# Patient Record
Sex: Male | Born: 1970
Health system: Southern US, Community
[De-identification: ages and names within clinical notes are randomized; demographics above are authoritative.]

## PROBLEM LIST (undated history)

## (undated) DIAGNOSIS — E785 Hyperlipidemia, unspecified: Secondary | ICD-10-CM

## (undated) DIAGNOSIS — F419 Anxiety disorder, unspecified: Secondary | ICD-10-CM

## (undated) DIAGNOSIS — K219 Gastro-esophageal reflux disease without esophagitis: Secondary | ICD-10-CM

## (undated) HISTORY — DX: Hyperlipidemia, unspecified: E78.5

## (undated) HISTORY — PX: VASECTOMY: SHX75

## (undated) HISTORY — DX: Gastro-esophageal reflux disease without esophagitis: K21.9

## (undated) HISTORY — DX: Anxiety disorder, unspecified: F41.9

---

## 2003-06-10 ENCOUNTER — Encounter: Payer: Self-pay | Admitting: Family Medicine

## 2003-06-10 ENCOUNTER — Ambulatory Visit (HOSPITAL_COMMUNITY): Admission: RE | Admit: 2003-06-10 | Discharge: 2003-06-10 | Payer: Self-pay | Admitting: Family Medicine

## 2004-10-13 ENCOUNTER — Ambulatory Visit: Payer: Self-pay | Admitting: Family Medicine

## 2004-10-20 ENCOUNTER — Ambulatory Visit: Payer: Self-pay | Admitting: Family Medicine

## 2004-11-02 ENCOUNTER — Encounter (INDEPENDENT_AMBULATORY_CARE_PROVIDER_SITE_OTHER): Payer: Self-pay | Admitting: Specialist

## 2004-11-02 ENCOUNTER — Ambulatory Visit (HOSPITAL_BASED_OUTPATIENT_CLINIC_OR_DEPARTMENT_OTHER): Admission: RE | Admit: 2004-11-02 | Discharge: 2004-11-02 | Payer: Self-pay | Admitting: Urology

## 2004-12-16 ENCOUNTER — Ambulatory Visit: Payer: Self-pay | Admitting: Family Medicine

## 2006-06-03 ENCOUNTER — Ambulatory Visit: Payer: Self-pay | Admitting: Family Medicine

## 2006-11-01 ENCOUNTER — Ambulatory Visit: Payer: Self-pay | Admitting: Family Medicine

## 2006-11-30 ENCOUNTER — Ambulatory Visit: Payer: Self-pay | Admitting: Family Medicine

## 2007-03-24 ENCOUNTER — Ambulatory Visit: Payer: Self-pay | Admitting: Family Medicine

## 2007-03-24 LAB — CONVERTED CEMR LAB
BUN: 12 mg/dL (ref 6–23)
Basophils Relative: 0 % (ref 0–1)
Calcium: 8.5 mg/dL (ref 8.4–10.5)
Chloride: 109 meq/L (ref 96–112)
Cholesterol: 161 mg/dL (ref 0–200)
Creatinine, Ser: 0.98 mg/dL (ref 0.40–1.50)
Eosinophils Absolute: 0.3 10*3/uL (ref 0.0–0.7)
Eosinophils Relative: 3 % (ref 0–5)
HCT: 44.5 % (ref 39.0–52.0)
HDL: 24 mg/dL — ABNORMAL LOW (ref >39)
Hemoglobin: 15.4 g/dL (ref 13.0–17.0)
Lymphs Abs: 1.8 10*3/uL (ref 0.7–3.3)
MCV: 84.6 fL (ref 78.0–100.0)
Platelets: 219 10*3/uL (ref 150–400)
Sodium: 144 meq/L (ref 135–145)
Total CHOL/HDL Ratio: 6.7
Triglycerides: 418 mg/dL — ABNORMAL HIGH (ref <150)
WBC: 9.7 10*3/uL (ref 4.0–10.5)

## 2007-06-20 DIAGNOSIS — K219 Gastro-esophageal reflux disease without esophagitis: Secondary | ICD-10-CM | POA: Insufficient documentation

## 2009-05-05 ENCOUNTER — Ambulatory Visit: Payer: Self-pay | Admitting: Family Medicine

## 2009-05-05 DIAGNOSIS — L0292 Furuncle, unspecified: Secondary | ICD-10-CM | POA: Insufficient documentation

## 2009-05-05 DIAGNOSIS — F411 Generalized anxiety disorder: Secondary | ICD-10-CM | POA: Insufficient documentation

## 2009-05-05 DIAGNOSIS — L0293 Carbuncle, unspecified: Secondary | ICD-10-CM

## 2009-05-05 DIAGNOSIS — B356 Tinea cruris: Secondary | ICD-10-CM

## 2009-05-05 DIAGNOSIS — E785 Hyperlipidemia, unspecified: Secondary | ICD-10-CM

## 2009-05-06 ENCOUNTER — Encounter: Payer: Self-pay | Admitting: Family Medicine

## 2009-11-04 ENCOUNTER — Ambulatory Visit: Payer: Self-pay | Admitting: Family Medicine

## 2009-11-04 DIAGNOSIS — J209 Acute bronchitis, unspecified: Secondary | ICD-10-CM | POA: Insufficient documentation

## 2010-02-17 ENCOUNTER — Ambulatory Visit: Payer: Self-pay | Admitting: Family Medicine

## 2010-10-25 LAB — CONVERTED CEMR LAB
AST: 23 units/L (ref 0–37)
Alkaline Phosphatase: 107 units/L (ref 39–117)
BUN: 14 mg/dL (ref 6–23)
Basophils Absolute: 0 10*3/uL (ref 0.0–0.1)
CO2: 30 meq/L (ref 19–32)
Calcium: 8.9 mg/dL (ref 8.4–10.5)
Chloride: 108 meq/L (ref 96–112)
Cholesterol: 156 mg/dL (ref 0–200)
Creatinine, Ser: 0.9 mg/dL (ref 0.4–1.5)
Eosinophils Absolute: 0.3 10*3/uL (ref 0.0–0.7)
Eosinophils Relative: 3.4 % (ref 0.0–5.0)
GFR calc non Af Amer: 97.52 mL/min (ref >60)
LDL Cholesterol: 99 mg/dL (ref 0–99)
MCHC: 35 g/dL (ref 30.0–36.0)
Monocytes Absolute: 0.5 10*3/uL (ref 0.1–1.0)
Monocytes Relative: 6.3 % (ref 3.0–12.0)
Neutrophils Relative %: 59.9 % (ref 43.0–77.0)
Platelets: 210 10*3/uL (ref 150.0–400.0)
Potassium: 4.8 meq/L (ref 3.5–5.1)
RDW: 13 % (ref 11.5–14.6)
TSH: 1.73 microintl units/mL (ref 0.35–5.50)
Total Protein: 6.7 g/dL (ref 6.0–8.3)
VLDL: 29 mg/dL (ref 0.0–40.0)

## 2010-10-27 NOTE — Assessment & Plan Note (Signed)
Summary: cpx/pt will come in fasting/njr   Vital Signs:  Patient profile:   40 year old male Weight:      251 pounds BMI:     35.13 Pulse rate:   64 / minute Pulse rhythm:   irregular BP sitting:   110 / 84  (left arm) Cuff size:   large  Vitals Entered By: Raechel Ache, RN (Feb 17, 2010 9:10 AM) CC: CPX, fasting. Recent vertigo, better now. Legs get sore after sitting.    History of Present Illness: 40 yr old male for a cpx. He feels fine, but he has been under a lot of financial stress. He has been unemployed for 2 years, and although his wife works full time, they have been having sokme difficulties. he tries to exercise regularly, including playing basketball. He tries to eat well, and he has lost about 16 lbs in the past year.   Preventive Screening-Counseling & Management  Alcohol-Tobacco     Smoking Status: never  Allergies (verified): No Known Drug Allergies  Past History:  Past Medical History: GERD Anxiety Hyperlipidemia MRSA infection in boils in axillae and groins 04-2009  Past Surgical History: Reviewed history from 06/20/2007 and no changes required. Vasectomy  2007  Family History: Reviewed history from 05/05/2009 and no changes required. Family History of CAD Male 1st degree relative <50 Family History Hypertension Family History Breast cancer 1st degree relative <50  Social History: Reviewed history from 05/05/2009 and no changes required. Married Never Smoked Alcohol use-no Drug use-no  Review of Systems  The patient denies anorexia, fever, weight loss, weight gain, vision loss, decreased hearing, hoarseness, chest pain, syncope, dyspnea on exertion, peripheral edema, prolonged cough, headaches, hemoptysis, abdominal pain, melena, hematochezia, severe indigestion/heartburn, hematuria, incontinence, genital sores, muscle weakness, suspicious skin lesions, transient blindness, difficulty walking, unusual weight change, abnormal bleeding,  enlarged lymph nodes, angioedema, breast masses, and testicular masses.    Physical Exam  General:  overweight-appearing.   Head:  Normocephalic and atraumatic without obvious abnormalities. No apparent alopecia or balding. Eyes:  No corneal or conjunctival inflammation noted. EOMI. Perrla. Funduscopic exam benign, without hemorrhages, exudates or papilledema. Vision grossly normal. Ears:  External ear exam shows no significant lesions or deformities.  Otoscopic examination reveals clear canals, tympanic membranes are intact bilaterally without bulging, retraction, inflammation or discharge. Hearing is grossly normal bilaterally. Nose:  External nasal examination shows no deformity or inflammation. Nasal mucosa are pink and moist without lesions or exudates. Mouth:  Oral mucosa and oropharynx without lesions or exudates.  Teeth in good repair. Neck:  No deformities, masses, or tenderness noted. Chest Wall:  No deformities, masses, tenderness or gynecomastia noted. Lungs:  Normal respiratory effort, chest expands symmetrically. Lungs are clear to auscultation, no crackles or wheezes. Heart:  Normal rate and regular rhythm. S1 and S2 normal without gallop, murmur, click, rub or other extra sounds. EKG normal Abdomen:  Bowel sounds positive,abdomen soft and non-tender without masses, organomegaly or hernias noted. Rectal:  No external abnormalities noted. Normal sphincter tone. No rectal masses or tenderness. Genitalia:  Testes bilaterally descended without nodularity, tenderness or masses. No scrotal masses or lesions. No penis lesions or urethral discharge. Prostate:  Prostate gland firm and smooth, no enlargement, nodularity, tenderness, mass, asymmetry or induration. Msk:  No deformity or scoliosis noted of thoracic or lumbar spine.   Pulses:  R and L carotid,radial,femoral,dorsalis pedis and posterior tibial pulses are full and equal bilaterally Extremities:  No clubbing, cyanosis, edema, or  deformity noted  with normal full range of motion of all joints.   Neurologic:  No cranial nerve deficits noted. Station and gait are normal. Plantar reflexes are down-going bilaterally. DTRs are symmetrical throughout. Sensory, motor and coordinative functions appear intact. Skin:  Intact without suspicious lesions or rashes Cervical Nodes:  No lymphadenopathy noted Axillary Nodes:  No palpable lymphadenopathy Inguinal Nodes:  No significant adenopathy Psych:  Cognition and judgment appear intact. Alert and cooperative with normal attention span and concentration. No apparent delusions, illusions, hallucinations   Impression & Recommendations:  Problem # 1:  HEALTH MAINTENANCE EXAM (ICD-V70.0)  Orders: UA Dipstick w/o Micro (automated)  (81003) EKG w/ Interpretation (93000) Venipuncture (16109) TLB-Lipid Panel (80061-LIPID) TLB-BMP (Basic Metabolic Panel-BMET) (80048-METABOL) TLB-CBC Platelet - w/Differential (85025-CBCD) TLB-Hepatic/Liver Function Pnl (80076-HEPATIC) TLB-TSH (Thyroid Stimulating Hormone) (60454-UJW)  Patient Instructions: 1)  Get labs today  Appended Document: Orders Update    Clinical Lists Changes  Observations: Added new observation of COMMENTS: Wynona Canes, CMA  Feb 17, 2010 10:29 AM  (02/17/2010 10:28) Added new observation of APPEARANCE U: Clear (02/17/2010 10:28) Added new observation of UA COLOR: yellow (02/17/2010 10:28) Added new observation of WBC DIPSTK U: negative (02/17/2010 10:28) Added new observation of NITRITE URN: negative (02/17/2010 10:28) Added new observation of UROBILINOGEN: negative (02/17/2010 10:28) Added new observation of PROTEIN, URN: 1+ (02/17/2010 10:28) Added new observation of BLOOD UR DIP: negative (02/17/2010 10:28) Added new observation of KETONES URN: negative (02/17/2010 10:28) Added new observation of BILIRUBIN UR: negative (02/17/2010 10:28) Added new observation of GLUCOSE, URN: negative (02/17/2010  10:28)      Laboratory Results   Urine Tests  Date/Time Recieved: Feb 17, 2010 10:28 AM  Date/Time Reported: Feb 17, 2010 10:28 AM   Routine Urinalysis   Color: yellow Appearance: Clear Glucose: negative   (Normal Range: Negative) Bilirubin: negative   (Normal Range: Negative) Ketone: negative   (Normal Range: Negative) Blood: negative   (Normal Range: Negative) Protein: 1+   (Normal Range: Negative) Urobilinogen: negative   (Normal Range: 0-1) Nitrite: negative   (Normal Range: Negative) Leukocyte Esterace: negative   (Normal Range: Negative)    Comments: Wynona Canes, CMA  Feb 17, 2010 10:29 AM

## 2010-10-27 NOTE — Assessment & Plan Note (Signed)
Summary: COUGH CHEST HURTS/PS   Vital Signs:  Patient profile:   40 year old male Weight:      262 pounds Temp:     98.6 degrees F oral BP sitting:   132 / 78  (left arm) Cuff size:   large  Vitals Entered By: Alfred Levins, CMA (November 04, 2009 9:39 AM) CC: cough, congestion x1 wk   History of Present Illness: Here for one week of PND, ST, chest burning, and coughing up green sputum. No fever.   Current Medications (verified): 1)  None  Allergies (verified): No Known Drug Allergies  Past History:  Past Medical History: Reviewed history from 05/05/2009 and no changes required. GERD Anxiety Hyperlipidemia  Review of Systems  The patient denies anorexia, fever, weight loss, weight gain, vision loss, decreased hearing, hoarseness, chest pain, syncope, dyspnea on exertion, peripheral edema, headaches, hemoptysis, abdominal pain, melena, hematochezia, severe indigestion/heartburn, hematuria, incontinence, genital sores, muscle weakness, suspicious skin lesions, transient blindness, difficulty walking, depression, unusual weight change, abnormal bleeding, enlarged lymph nodes, angioedema, breast masses, and testicular masses.    Physical Exam  General:  Well-developed,well-nourished,in no acute distress; alert,appropriate and cooperative throughout examination Head:  Normocephalic and atraumatic without obvious abnormalities. No apparent alopecia or balding. Eyes:  No corneal or conjunctival inflammation noted. EOMI. Perrla. Funduscopic exam benign, without hemorrhages, exudates or papilledema. Vision grossly normal. Ears:  External ear exam shows no significant lesions or deformities.  Otoscopic examination reveals clear canals, tympanic membranes are intact bilaterally without bulging, retraction, inflammation or discharge. Hearing is grossly normal bilaterally. Nose:  External nasal examination shows no deformity or inflammation. Nasal mucosa are pink and moist without lesions  or exudates. Mouth:  Oral mucosa and oropharynx without lesions or exudates.  Teeth in good repair. Neck:  No deformities, masses, or tenderness noted. Lungs:  scattered rhonchi   Impression & Recommendations:  Problem # 1:  ACUTE BRONCHITIS (ICD-466.0)  The following medications were removed from the medication list:    Doxycycline Hyclate 100 Mg Caps (Doxycycline hyclate) .Marland Kitchen..Marland Kitchen Two times a day His updated medication list for this problem includes:    Zithromax Z-pak 250 Mg Tabs (Azithromycin) .Marland Kitchen... As directed    Hydromet 5-1.5 Mg/38ml Syrp (Hydrocodone-homatropine) .Marland Kitchen... 1 tsp q 4 hours as needed cough  Complete Medication List: 1)  Zithromax Z-pak 250 Mg Tabs (Azithromycin) .... As directed 2)  Hydromet 5-1.5 Mg/50ml Syrp (Hydrocodone-homatropine) .Marland Kitchen.. 1 tsp q 4 hours as needed cough  Patient Instructions: 1)  Please schedule a follow-up appointment as needed .  Prescriptions: HYDROMET 5-1.5 MG/5ML SYRP (HYDROCODONE-HOMATROPINE) 1 tsp q 4 hours as needed cough  #240 x 0   Entered and Authorized by:   Nelwyn Salisbury MD   Signed by:   Nelwyn Salisbury MD on 11/04/2009   Method used:   Print then Give to Patient   RxID:   [id number] ZITHROMAX Z-PAK 250 MG TABS (AZITHROMYCIN) as directed  #1 x 0   Entered and Authorized by:   Nelwyn Salisbury MD   Signed by:   Nelwyn Salisbury MD on 11/04/2009   Method used:   Print then Give to Patient   RxID:   [id number]

## 2011-02-12 NOTE — Op Note (Signed)
NAME:  Daniel Rodriguez, Daniel Rodriguez                  ACCOUNT NO.:  [id number]   MEDICAL RECORD NO.:  [id number]          PATIENT TYPE:  AMB   LOCATION:  [facility]                         FACILITY:  [facility]   PHYSICIAN:  Sigmund I. Patsi Sears, M.D.DATE OF BIRTH:  1971-07-02   DATE OF PROCEDURE:  DATE OF DISCHARGE:                                 OPERATIVE REPORT   PREOPERATIVE DIAGNOSIS:  Elective sterilization.   POSTOPERATIVE DIAGNOSIS:  Elective sterilization.   OPERATION:  Vasectomy.   SURGEON:  Sigmund I. Patsi Sears, M.D.   ANESTHESIA:  General LMA.Marland Kitchen   PREPARATION:  After appropriate preanesthesia, the patient was brought to  the operating room, placed on the operating room in dorsal supine position  where general LMA anesthesia was introduced. He remained in this position,  where the pubis and penis were prepped with Betadine solution, draped in  usual fashion.   The patient had previously shaven and prepped himself, because of this, he  was covered with a Ancef 1 gram IV.   The vas was isolated bilaterally, a 0.5 cm incision was made bilaterally.  Subcutaneous tissue was dissected, and the vas was isolated, doubly clamped,  and the portion removed with the electrosurgical unit. Each end is then  ligated with 3-0 Vicryl suture, the wounds are closed with interrupted 3-0  Vicryl suture. Collodion was placed, and mesh pads were placed. The patient  was given IV Toradol, as well as local anesthesia. He was awakened and taken  to the recovery room in good condition.      SIT/MEDQ  D:  11/02/2004  T:  11/02/2004  Job:  [id number]

## 2011-09-20 ENCOUNTER — Encounter: Payer: Self-pay | Admitting: Family Medicine

## 2011-09-20 ENCOUNTER — Ambulatory Visit (INDEPENDENT_AMBULATORY_CARE_PROVIDER_SITE_OTHER): Payer: PRIVATE HEALTH INSURANCE | Admitting: Family Medicine

## 2011-09-20 VITALS — BP 140/92 | HR 113 | Temp 99.3°F | Ht 71.0 in | Wt 260.0 lb

## 2011-09-20 DIAGNOSIS — R05 Cough: Secondary | ICD-10-CM

## 2011-09-20 DIAGNOSIS — J209 Acute bronchitis, unspecified: Secondary | ICD-10-CM

## 2011-09-20 MED ORDER — AZITHROMYCIN 250 MG PO TABS: 250.0000 mg | ORAL_TABLET | Freq: Every day | ORAL | Status: AC

## 2011-09-20 NOTE — Progress Notes (Signed)
  Subjective:    Patient ID: Daniel Rodriguez, male    DOB: December 24, 1970, 40 y.o.   MRN: [id number]  HPI 40 year old white male, patient of Dr. Clent Ridges, nonsmoker, and complaints of cough, congestion, runny nose that's been getting progressively worse over the last one week. Cough is productive of yellow sputum. Did take an over-the-counter Delsym, he will, and Dimetapp with little to no relief. His past medical history of bronchitis and upper respiratory infections.  Review of Systems  Constitutional: Positive for fatigue.  HENT: Positive for congestion and postnasal drip.   Respiratory: Positive for cough.   Cardiovascular: Negative.   Gastrointestinal: Negative.        Nausea  Musculoskeletal: Negative.   Skin: Negative.   Neurological: Negative.   Hematological: Negative.    Past Medical History  Diagnosis Date  . GERD (gastroesophageal reflux disease)   . Anxiety   . Hyperlipidemia     History   Social History  . Marital Status: Married    Spouse Name: N/A    Number of Children: N/A  . Years of Education: N/A   Occupational History  . Not on file.   Social History Main Topics  . Smoking status: Never Smoker   . Smokeless tobacco: Not on file  . Alcohol Use: No  . Drug Use: No  . Sexually Active: Not on file   Other Topics Concern  . Not on file   Social History Narrative  . No narrative on file    Past Surgical History  Procedure Date  . Vasectomy     Family History  Problem Relation Age of Onset  . Hypertension      fhx  . Cancer      fhx  . Coronary artery disease      fhx    No Known Allergies  No current outpatient prescriptions on file prior to visit.    BP 140/92  Pulse 113  Temp(Src) 99.3 F (37.4 C) (Oral)  Ht 5\' 11"  (1.803 m)  Wt 260 lb (117.935 kg)  BMI 36.26 kg/m2chart   Objective:   Physical Exam  Constitutional: He is oriented to person, place, and time. He appears well-developed and well-nourished.  Neck: Normal range of motion.  Neck supple.  Cardiovascular: Normal rate, regular rhythm and normal heart sounds.   Pulmonary/Chest: Effort normal and breath sounds normal.  Abdominal: Soft. Bowel sounds are normal.  Musculoskeletal: Normal range of motion.  Neurological: He is alert and oriented to person, place, and time.  Skin: Skin is warm and dry.  Psychiatric: He has a normal mood and affect.          Assessment & Plan:  Assessment: Acute bronchitis, cough  Plan: Z-Pak as directed. Over-the-counter Delsym when necessary. Antihistamine like Claritin or Zyrtec once daily. Patient to call the office if symptoms worsen or persist recheck as scheduled and when necessary.

## 2013-04-25 ENCOUNTER — Encounter: Payer: Self-pay | Admitting: Internal Medicine

## 2013-04-25 ENCOUNTER — Ambulatory Visit (INDEPENDENT_AMBULATORY_CARE_PROVIDER_SITE_OTHER): Payer: PRIVATE HEALTH INSURANCE | Admitting: Internal Medicine

## 2013-04-25 VITALS — BP 134/86 | HR 66 | Temp 98.0°F | Wt 273.0 lb

## 2013-04-25 DIAGNOSIS — J309 Allergic rhinitis, unspecified: Secondary | ICD-10-CM

## 2013-04-25 DIAGNOSIS — F439 Reaction to severe stress, unspecified: Secondary | ICD-10-CM

## 2013-04-25 DIAGNOSIS — Z733 Stress, not elsewhere classified: Secondary | ICD-10-CM

## 2013-04-25 DIAGNOSIS — G44209 Tension-type headache, unspecified, not intractable: Secondary | ICD-10-CM

## 2013-04-25 MED ORDER — CYCLOBENZAPRINE HCL 10 MG PO TABS: 10.0000 mg | ORAL_TABLET | Freq: Three times a day (TID) | ORAL | Status: DC | PRN

## 2013-04-25 MED ORDER — PREDNISONE 10 MG PO TABS: ORAL_TABLET | ORAL | Status: DC

## 2013-04-25 NOTE — Patient Instructions (Signed)
Allergic Rhinitis Allergic rhinitis is when the mucous membranes in the nose respond to allergens. Allergens are particles in the air that cause your body to have an allergic reaction. This causes you to release allergic antibodies. Through a chain of events, these eventually cause you to release histamine into the blood stream (hence the use of antihistamines). Although meant to be protective to the body, it is this release that causes your discomfort, such as frequent sneezing, congestion and an itchy runny nose.  CAUSES  The pollen allergens may come from grasses, trees, and weeds. This is seasonal allergic rhinitis, or "hay fever." Other allergens cause year-round allergic rhinitis (perennial allergic rhinitis) such as house dust mite allergen, pet dander and mold spores.  SYMPTOMS   Nasal stuffiness (congestion).  Runny, itchy nose with sneezing and tearing of the eyes.  There is often an itching of the mouth, eyes and ears. It cannot be cured, but it can be controlled with medications. DIAGNOSIS  If you are unable to determine the offending allergen, skin or blood testing may find it. TREATMENT   Avoid the allergen.  Medications and allergy shots (immunotherapy) can help.  Hay fever may often be treated with antihistamines in pill or nasal spray forms. Antihistamines block the effects of histamine. There are over-the-counter medicines that may help with nasal congestion and swelling around the eyes. Check with your caregiver before taking or giving this medicine. If the treatment above does not work, there are many new medications your caregiver can prescribe. Stronger medications may be used if initial measures are ineffective. Desensitizing injections can be used if medications and avoidance fails. Desensitization is when a patient is given ongoing shots until the body becomes less sensitive to the allergen. Make sure you follow up with your caregiver if problems continue. SEEK MEDICAL  CARE IF:   You develop fever (more than 100.5 F (38.1 C).  You develop a cough that does not stop easily (persistent).  You have shortness of breath.  You start wheezing.  Symptoms interfere with normal daily activities. Document Released: 06/08/2001 Document Revised: 12/06/2011 Document Reviewed: 12/18/2008 [employer] Patient Information 2014 [employer].  

## 2013-04-25 NOTE — Progress Notes (Signed)
HPI  Pt presents to the clinic today with c/o dizziness, sinus pressure and headache. This started 2 weeks ago. He denies fever, chills or body aches. He does have a history of allergies. He tried to take Sudafed and all it did was put him to sleep. He is not on a daily antihistamine. He has not had sick contacts. The headache is constant. It starts in the back of his neck and spreads up to the bottom of his head. He reports that it feels tight. He has taken ibuprofen which has helped. He does have a history of migraines and reports this feels different.  Review of Systems    Past Medical History  Diagnosis Date  . GERD (gastroesophageal reflux disease)   . Anxiety   . Hyperlipidemia     Family History  Problem Relation Age of Onset  . Hypertension      fhx  . Cancer      fhx  . Coronary artery disease      fhx    History   Social History  . Marital Status: Married    Spouse Name: N/A    Number of Children: N/A  . Years of Education: N/A   Occupational History  . Not on file.   Social History Main Topics  . Smoking status: Never Smoker   . Smokeless tobacco: Not on file  . Alcohol Use: No  . Drug Use: No  . Sexually Active: Not on file   Other Topics Concern  . Not on file   Social History Narrative  . No narrative on file    No Known Allergies   Constitutional: Positive headache, fatigue and fever. Denies abrupt weight changes.  HEENT:  Positive eye pain, pressure behind the eyes, facial pain, nasal congestion and sore throat. Denies eye redness, ear pain, ringing in the ears, wax buildup, runny nose or bloody nose. Respiratory: Positive cough and thick green sputum production. Denies difficulty breathing or shortness of breath.  Cardiovascular: Denies chest pain, chest tightness, palpitations or swelling in the hands or feet.   No other specific complaints in a complete review of systems (except as listed in HPI above).  Objective:    BP 134/86  Pulse  66  Temp(Src) 98 F (36.7 C) (Oral)  Wt 273 lb (123.832 kg)  BMI 38.09 kg/m2  SpO2 95% Wt Readings from Last 3 Encounters:  04/25/13 273 lb (123.832 kg)  09/20/11 260 lb (117.935 kg)  02/17/10 251 lb (113.853 kg)    General: Appears his stated age, well developed, well nourished in NAD. HEENT: Head: normal shape and size; Eyes: sclera white, no icterus, conjunctiva pink, PERRLA and EOMs intact; Ears: Tm's gray and intact, normal light reflex; Nose: mucosa boggy and moist, septum midline; Throat/Mouth: + PND. Teeth present, mucosa pink and moist, no exudate noted, no lesions or ulcerations noted.  Neck: Neck supple, trachea midline. No massses, lumps or thyromegaly present.  Cardiovascular: Normal rate and rhythm. S1,S2 noted.  No murmur, rubs or gallops noted. No JVD or BLE edema. No carotid bruits noted. Pulmonary/Chest: Normal effort and positive vesicular breath sounds. No respiratory distress. No wheezes, rales or ronchi noted.      Assessment & Plan:   Dizziness secondary to allergic rhinitis:  Can use a Neti Pot which can be purchased from your local drug store. eRx for pred taper  Headaches secondary to stress:  eRx for flexeril for tension headaches  RTC as needed or if symptoms persist or worsen  RTC as needed or if symptoms persist.

## 2013-12-06 ENCOUNTER — Emergency Department
Admission: EM | Admit: 2013-12-06 | Discharge: 2013-12-06 | Disposition: A | Discharge status: Home / Self Care | Payer: PRIVATE HEALTH INSURANCE | Source: Home / Self Care | Attending: Family Medicine | Admitting: Family Medicine

## 2013-12-06 ENCOUNTER — Encounter: Payer: Self-pay | Admitting: Emergency Medicine

## 2013-12-06 DIAGNOSIS — J069 Acute upper respiratory infection, unspecified: Secondary | ICD-10-CM

## 2013-12-06 MED ORDER — AZITHROMYCIN 250 MG PO TABS: ORAL_TABLET | ORAL | Status: DC

## 2013-12-06 NOTE — ED Provider Notes (Signed)
CSN: [id number]     Arrival date & time 12/06/13  1012 History   None    Chief Complaint  Patient presents with  . Nasal Congestion    HPI  URI Symptoms Onset: 2-3 days  Description: rhinorrhea, nasal congestion, cough  Modifying factors:  Multiple sick contacts with similar sxs   Symptoms Nasal discharge: yes Fever: no Sore throat: no Cough: yes Wheezing: no Ear pain: no GI symptoms: no Sick contacts: yes  Red Flags  Stiff neck: no Dyspnea: no Rash: no Swallowing difficulty: no  Sinusitis Risk Factors Headache/face pain: no Double sickening: no tooth pain: no  Allergy Risk Factors Sneezing: no Itchy scratchy throat: no Seasonal symptoms: no  Flu Risk Factors Headache: no muscle aches: no severe fatigue: no   Past Medical History  Diagnosis Date  . GERD (gastroesophageal reflux disease)   . Anxiety   . Hyperlipidemia    Past Surgical History  Procedure Laterality Date  . Vasectomy     Family History  Problem Relation Age of Onset  . Hypertension      fhx  . Cancer      fhx  . Coronary artery disease      fhx  . Coronary artery disease Father    History  Substance Use Topics  . Smoking status: Never Smoker   . Smokeless tobacco: Never Used  . Alcohol Use: No    Review of Systems  All other systems reviewed and are negative.    Allergies  Review of patient's allergies indicates no known allergies.  Home Medications   Current Outpatient Rx  Name  Route  Sig  Dispense  Refill  . azithromycin (ZITHROMAX) 250 MG tablet      Take 2 tabs PO x 1 dose, then 1 tab PO QD x 4 days   6 tablet   0    BP 143/87  Pulse 67  Temp(Src) 97.8 F (36.6 C) (Oral)  Resp 14  Wt 275 lb (124.739 kg)  SpO2 96% Physical Exam  Constitutional: He appears well-developed and well-nourished.  HENT:  Head: Normocephalic and atraumatic.  Right Ear: External ear normal.  Left Ear: External ear normal.  +nasal erythema, rhinorrhea bilaterally, + post  oropharyngeal erythema    Eyes: Pupils are equal, round, and reactive to light.  Neck: Normal range of motion. Neck supple.  Cardiovascular: Normal rate and regular rhythm.   Pulmonary/Chest: Effort normal and breath sounds normal.  Abdominal: Soft.  Musculoskeletal: Normal range of motion.  Neurological: He is alert.  Skin: Skin is warm.    ED Course  Procedures (including critical care time) Labs Review Labs Reviewed - No data to display Imaging Review No results found.   MDM   1. URI (upper respiratory infection)    Likely viral process Exam reassuring  Discussed supportive care and infectious/resp red flags.  PPx rx for zpak given if sxs worsen/fail to improve over next 7-10 days.  Follow up as needed.     The patient and/or caregiver has been counseled thoroughly with regard to treatment plan and/or medications prescribed including dosage, schedule, interactions, rationale for use, and possible side effects and they verbalize understanding. Diagnoses and expected course of recovery discussed and will return if not improved as expected or if the condition worsens. Patient and/or caregiver verbalized understanding.         Doree AlbeeSteven Cecia Egge, MD 12/06/13 1027

## 2013-12-06 NOTE — ED Notes (Signed)
Daniel Rodriguez c/o sneezing, congestion and non-productive cough x 1 week. No fever.

## 2013-12-07 ENCOUNTER — Encounter: Payer: Self-pay | Admitting: Family Medicine

## 2013-12-07 ENCOUNTER — Ambulatory Visit (INDEPENDENT_AMBULATORY_CARE_PROVIDER_SITE_OTHER): Payer: PRIVATE HEALTH INSURANCE | Admitting: Family Medicine

## 2013-12-07 VITALS — BP 154/98 | HR 76 | Temp 98.7°F | Ht 71.0 in | Wt 272.0 lb

## 2013-12-07 DIAGNOSIS — IMO0001 Reserved for inherently not codable concepts without codable children: Secondary | ICD-10-CM

## 2013-12-07 DIAGNOSIS — R030 Elevated blood-pressure reading, without diagnosis of hypertension: Secondary | ICD-10-CM

## 2013-12-07 LAB — HEPATIC FUNCTION PANEL
ALT: 31 U/L (ref 0–53)
AST: 23 U/L (ref 0–37)
Albumin: 4 g/dL (ref 3.5–5.2)
Alkaline Phosphatase: 105 U/L (ref 39–117)
Bilirubin, Direct: 0.1 mg/dL (ref 0.0–0.3)
Total Bilirubin: 0.7 mg/dL (ref 0.3–1.2)
Total Protein: 7 g/dL (ref 6.0–8.3)

## 2013-12-07 LAB — CBC WITH DIFFERENTIAL/PLATELET
BASOS PCT: 0.3 % (ref 0.0–3.0)
Basophils Absolute: 0 10*3/uL (ref 0.0–0.1)
Eosinophils Absolute: 0.3 10*3/uL (ref 0.0–0.7)
Eosinophils Relative: 3.6 % (ref 0.0–5.0)
HEMATOCRIT: 47.4 % (ref 39.0–52.0)
HEMOGLOBIN: 16 g/dL (ref 13.0–17.0)
LYMPHS ABS: 2.8 10*3/uL (ref 0.7–4.0)
Lymphocytes Relative: 30.3 % (ref 12.0–46.0)
MCHC: 33.9 g/dL (ref 30.0–36.0)
MCV: 88.8 fl (ref 78.0–100.0)
MONO ABS: 0.5 10*3/uL (ref 0.1–1.0)
MONOS PCT: 5.7 % (ref 3.0–12.0)
NEUTROS ABS: 5.5 10*3/uL (ref 1.4–7.7)
Neutrophils Relative %: 60.1 % (ref 43.0–77.0)
Platelets: 243 10*3/uL (ref 150.0–400.0)
RBC: 5.34 Mil/uL (ref 4.22–5.81)
RDW: 13.6 % (ref 11.5–14.6)
WBC: 9.1 10*3/uL (ref 4.5–10.5)

## 2013-12-07 LAB — POCT URINALYSIS DIPSTICK
Bilirubin, UA: NEGATIVE
GLUCOSE UA: NEGATIVE
KETONES UA: NEGATIVE
Leukocytes, UA: NEGATIVE
Nitrite, UA: NEGATIVE
RBC UA: NEGATIVE
Spec Grav, UA: 1.025
Urobilinogen, UA: 0.2
pH, UA: 6

## 2013-12-07 LAB — TSH: TSH: 1.97 u[IU]/mL (ref 0.35–5.50)

## 2013-12-07 LAB — BASIC METABOLIC PANEL
BUN: 13 mg/dL (ref 6–23)
CALCIUM: 8.9 mg/dL (ref 8.4–10.5)
CHLORIDE: 105 meq/L (ref 96–112)
CO2: 27 meq/L (ref 19–32)
CREATININE: 1 mg/dL (ref 0.4–1.5)
GFR: 85.91 mL/min (ref >60.00)
Glucose, Bld: 86 mg/dL (ref 70–99)
Potassium: 3.8 mEq/L (ref 3.5–5.1)
SODIUM: 140 meq/L (ref 135–145)

## 2013-12-07 NOTE — Progress Notes (Signed)
Pre visit review using our clinic review tool, if applicable. No additional management support is needed unless otherwise documented below in the visit note. 

## 2013-12-07 NOTE — Progress Notes (Signed)
   Subjective:    Patient ID: Daniel Rodriguez, male    DOB: 10/09/1970, 43 y.o.   MRN: [id number]  HPI Here with concerns over his BP. He admits to being overweight and having put on more weight in the past year. He does not watch his diet and he has not been active. No chest pain or HAs or SOB. He went to Urgent Care yesterday and was put on a Zapck for a URI. His BP was a little high then.    Review of Systems  Constitutional: Negative.   Respiratory: Negative.   Cardiovascular: Negative.        Objective:   Physical Exam  Constitutional: He appears well-developed and well-nourished.  Neck: No thyromegaly present.  Cardiovascular: Normal rate, regular rhythm, normal heart sounds and intact distal pulses.   EKG normal   Pulmonary/Chest: Effort normal and breath sounds normal.  Lymphadenopathy:    He has no cervical adenopathy.          Assessment & Plan:  He does have an elevated BP and we discussed his diet and exercise habits. He will make a concerted effort to lose weight and we will recheck him in 90 days.

## 2014-03-01 ENCOUNTER — Other Ambulatory Visit (INDEPENDENT_AMBULATORY_CARE_PROVIDER_SITE_OTHER): Payer: PRIVATE HEALTH INSURANCE

## 2014-03-01 DIAGNOSIS — Z0000 Encounter for general adult medical examination without abnormal findings: Secondary | ICD-10-CM

## 2014-03-01 LAB — HEPATIC FUNCTION PANEL
ALBUMIN: 3.7 g/dL (ref 3.5–5.2)
ALK PHOS: 103 U/L (ref 39–117)
ALT: 34 U/L (ref 0–53)
AST: 25 U/L (ref 0–37)
BILIRUBIN TOTAL: 0.8 mg/dL (ref 0.2–1.2)
Bilirubin, Direct: 0.1 mg/dL (ref 0.0–0.3)
Total Protein: 6.5 g/dL (ref 6.0–8.3)

## 2014-03-01 LAB — BASIC METABOLIC PANEL
BUN: 14 mg/dL (ref 6–23)
CALCIUM: 8.4 mg/dL (ref 8.4–10.5)
CO2: 28 meq/L (ref 19–32)
CREATININE: 0.9 mg/dL (ref 0.4–1.5)
Chloride: 106 mEq/L (ref 96–112)
GFR: 96.92 mL/min (ref >60.00)
GLUCOSE: 89 mg/dL (ref 70–99)
Potassium: 4.2 mEq/L (ref 3.5–5.1)
Sodium: 139 mEq/L (ref 135–145)

## 2014-03-01 LAB — CBC WITH DIFFERENTIAL/PLATELET
BASOS ABS: 0 10*3/uL (ref 0.0–0.1)
Basophils Relative: 0.3 % (ref 0.0–3.0)
EOS ABS: 0.3 10*3/uL (ref 0.0–0.7)
Eosinophils Relative: 4.2 % (ref 0.0–5.0)
HCT: 45.2 % (ref 39.0–52.0)
HEMOGLOBIN: 15.4 g/dL (ref 13.0–17.0)
LYMPHS PCT: 27.8 % (ref 12.0–46.0)
Lymphs Abs: 2.2 10*3/uL (ref 0.7–4.0)
MCHC: 34 g/dL (ref 30.0–36.0)
MCV: 89 fl (ref 78.0–100.0)
Monocytes Absolute: 0.6 10*3/uL (ref 0.1–1.0)
Monocytes Relative: 8 % (ref 3.0–12.0)
NEUTROS ABS: 4.6 10*3/uL (ref 1.4–7.7)
Neutrophils Relative %: 59.7 % (ref 43.0–77.0)
PLATELETS: 210 10*3/uL (ref 150.0–400.0)
RBC: 5.08 Mil/uL (ref 4.22–5.81)
RDW: 13.2 % (ref 11.5–15.5)
WBC: 7.8 10*3/uL (ref 4.0–10.5)

## 2014-03-01 LAB — LIPID PANEL
Cholesterol: 134 mg/dL (ref 0–200)
HDL: 26.8 mg/dL — ABNORMAL LOW (ref >39.00)
LDL Cholesterol: 84 mg/dL (ref 0–99)
NonHDL: 107.2
Total CHOL/HDL Ratio: 5
Triglycerides: 117 mg/dL (ref 0.0–149.0)
VLDL: 23.4 mg/dL (ref 0.0–40.0)

## 2014-03-01 LAB — POCT URINALYSIS DIPSTICK
Bilirubin, UA: NEGATIVE
Blood, UA: NEGATIVE
Glucose, UA: NEGATIVE
Ketones, UA: NEGATIVE
Leukocytes, UA: NEGATIVE
Nitrite, UA: NEGATIVE
SPEC GRAV UA: 1.025
UROBILINOGEN UA: 0.2
pH, UA: 6.5

## 2014-03-01 LAB — TSH: TSH: 2.35 u[IU]/mL (ref 0.35–4.50)

## 2014-03-08 ENCOUNTER — Ambulatory Visit (INDEPENDENT_AMBULATORY_CARE_PROVIDER_SITE_OTHER): Payer: PRIVATE HEALTH INSURANCE | Admitting: Family Medicine

## 2014-03-08 ENCOUNTER — Encounter: Payer: Self-pay | Admitting: Family Medicine

## 2014-03-08 VITALS — BP 159/97 | HR 75 | Temp 98.8°F | Ht 71.0 in | Wt 272.0 lb

## 2014-03-08 DIAGNOSIS — Z0000 Encounter for general adult medical examination without abnormal findings: Secondary | ICD-10-CM

## 2014-03-08 DIAGNOSIS — I10 Essential (primary) hypertension: Secondary | ICD-10-CM

## 2014-03-08 MED ORDER — LISINOPRIL-HYDROCHLOROTHIAZIDE 20-25 MG PO TABS: 1.0000 | ORAL_TABLET | Freq: Every day | ORAL | Status: DC

## 2014-03-08 NOTE — Progress Notes (Signed)
Pre visit review using our clinic review tool, if applicable. No additional management support is needed unless otherwise documented below in the visit note. 

## 2014-03-08 NOTE — Progress Notes (Signed)
   Subjective:    Patient ID: Daniel Rodriguez, male    DOB: 01/23/1971, 43 y.o.   MRN: [id number]  HPI 43 yr old male for a cpx. He feels well. We have been watching his BP and it remains high.    Review of Systems  Constitutional: Negative.   HENT: Negative.   Eyes: Negative.   Respiratory: Negative.   Cardiovascular: Negative.   Gastrointestinal: Negative.   Genitourinary: Negative.   Musculoskeletal: Negative.   Skin: Negative.   Neurological: Negative.   Psychiatric/Behavioral: Negative.        Objective:   Physical Exam  Constitutional: He is oriented to person, place, and time. He appears well-developed and well-nourished. No distress.  HENT:  Head: Normocephalic and atraumatic.  Right Ear: External ear normal.  Left Ear: External ear normal.  Nose: Nose normal.  Mouth/Throat: Oropharynx is clear and moist. No oropharyngeal exudate.  Eyes: Conjunctivae and EOM are normal. Pupils are equal, round, and reactive to light. Right eye exhibits no discharge. Left eye exhibits no discharge. No scleral icterus.  Neck: Neck supple. No JVD present. No tracheal deviation present. No thyromegaly present.  Cardiovascular: Normal rate, regular rhythm, normal heart sounds and intact distal pulses.  Exam reveals no gallop and no friction rub.   No murmur heard. Pulmonary/Chest: Effort normal and breath sounds normal. No respiratory distress. He has no wheezes. He has no rales. He exhibits no tenderness.  Abdominal: Soft. Bowel sounds are normal. He exhibits no distension and no mass. There is no tenderness. There is no rebound and no guarding.  Genitourinary: Rectum normal, prostate normal and penis normal. Guaiac negative stool. No penile tenderness.  Musculoskeletal: Normal range of motion. He exhibits no edema and no tenderness.  Lymphadenopathy:    He has no cervical adenopathy.  Neurological: He is alert and oriented to person, place, and time. He has normal reflexes. No cranial nerve  deficit. He exhibits normal muscle tone. Coordination normal.  Skin: Skin is warm and dry. No rash noted. He is not diaphoretic. No erythema. No pallor.  Psychiatric: He has a normal mood and affect. His behavior is normal. Judgment and thought content normal.          Assessment & Plan:  Well exam. Start on Lisinopril HCT and recheck in one month

## 2014-03-09 ENCOUNTER — Telehealth: Payer: Self-pay | Admitting: Family Medicine

## 2014-03-09 NOTE — Telephone Encounter (Signed)
Relevant patient education mailed to patient.  

## 2014-04-08 ENCOUNTER — Ambulatory Visit: Payer: PRIVATE HEALTH INSURANCE | Admitting: Family Medicine

## 2014-07-14 ENCOUNTER — Emergency Department (HOSPITAL_BASED_OUTPATIENT_CLINIC_OR_DEPARTMENT_OTHER)
Admission: EM | Admit: 2014-07-14 | Discharge: 2014-07-14 | Disposition: A | Discharge status: Home / Self Care | Payer: PRIVATE HEALTH INSURANCE | Attending: Emergency Medicine | Admitting: Emergency Medicine

## 2014-07-14 ENCOUNTER — Encounter (HOSPITAL_BASED_OUTPATIENT_CLINIC_OR_DEPARTMENT_OTHER): Payer: Self-pay | Admitting: Emergency Medicine

## 2014-07-14 DIAGNOSIS — Z79899 Other long term (current) drug therapy: Secondary | ICD-10-CM | POA: Insufficient documentation

## 2014-07-14 DIAGNOSIS — T783XXA Angioneurotic edema, initial encounter: Secondary | ICD-10-CM | POA: Diagnosis not present

## 2014-07-14 DIAGNOSIS — K219 Gastro-esophageal reflux disease without esophagitis: Secondary | ICD-10-CM

## 2014-07-14 DIAGNOSIS — K210 Gastro-esophageal reflux disease with esophagitis: Secondary | ICD-10-CM | POA: Diagnosis not present

## 2014-07-14 DIAGNOSIS — Y9289 Other specified places as the place of occurrence of the external cause: Secondary | ICD-10-CM | POA: Insufficient documentation

## 2014-07-14 DIAGNOSIS — T618X1A Toxic effect of other seafood, accidental (unintentional), initial encounter: Secondary | ICD-10-CM | POA: Diagnosis not present

## 2014-07-14 DIAGNOSIS — Z8639 Personal history of other endocrine, nutritional and metabolic disease: Secondary | ICD-10-CM | POA: Insufficient documentation

## 2014-07-14 DIAGNOSIS — R220 Localized swelling, mass and lump, head: Secondary | ICD-10-CM | POA: Diagnosis present

## 2014-07-14 DIAGNOSIS — Y9389 Activity, other specified: Secondary | ICD-10-CM | POA: Insufficient documentation

## 2014-07-14 DIAGNOSIS — Z8659 Personal history of other mental and behavioral disorders: Secondary | ICD-10-CM | POA: Diagnosis not present

## 2014-07-14 DIAGNOSIS — Z7952 Long term (current) use of systemic steroids: Secondary | ICD-10-CM | POA: Diagnosis not present

## 2014-07-14 MED ORDER — PREDNISONE 20 MG PO TABS: ORAL_TABLET | ORAL | Status: DC

## 2014-07-14 MED ORDER — FAMOTIDINE 20 MG PO TABS
20.0000 mg | ORAL_TABLET | Freq: Once | ORAL | Status: AC
  Administered 2014-07-14: 20 mg via ORAL
  Filled 2014-07-14: qty 1

## 2014-07-14 MED ORDER — PREDNISONE 50 MG PO TABS
60.0000 mg | ORAL_TABLET | Freq: Once | ORAL | Status: AC
  Administered 2014-07-14: 60 mg via ORAL
  Filled 2014-07-14 (×2): qty 1

## 2014-07-14 MED ORDER — FAMOTIDINE 20 MG PO TABS: 20.0000 mg | ORAL_TABLET | Freq: Two times a day (BID) | ORAL | Status: DC

## 2014-07-14 NOTE — ED Provider Notes (Signed)
CSN: [id number]     Arrival date & time 07/14/14  1803 History  This chart was scribed for Arby BarretteMarcy Aulden Calise, MD by Roxy Cedarhandni Bhalodia, ED Scribe. This patient was seen in room MH05/MH05 and the patient's care was started at 7:01 PM.   Chief Complaint  Patient presents with  . Allergic Reaction   Patient is a 43 y.o. male presenting with allergic reaction. The history is provided by the patient and the spouse. No language interpreter was used.  Allergic Reaction   HPI Comments: Daniel Rodriguez is a 43 y.o. male with no history of allergic reactions, who presents to the Emergency Department complaining of tongue swelling due to an unknown allergic reaction. Patient states that he was on a leisure business trip to [place] and had various foods and local beers to eat. Patient states that he had oysters, but has had oysters before without any allergic reactions. Patient states he feels swelling, itching on his tongue, with increased symptoms along the outside edges of his tongue on both sides. Patient also reports associated ulcer on the side of his mouth. Per wife, the swelling altered the patient's speech and patient was slurring earlier today. Patient states that he has moderate improvement of swelling after taking benadryl but it does not fully resolve the swelling. Patient states that he took 25mg  of benadryl this morning, and took more throughout the day as needed. He states that his last intake of benadryl was 50mg  two hours ago. Patient states that the swelling has caused onset of mild cough. He denies associated difficulty swallowing, trouble breathing, chest tightness, nausea, vomiting, abdominal pain. Patient has taken ibuprofen in the past 2 days. Patient does not currently take any daily medications.  Patient denies any past history of allergic reactions to ibuprofen. The patient reports that he had intake of many local craft beers over the course of the evening and had he been many different types  of oysters in large quantity, both raw and sees and  Past Medical History  Diagnosis Date  . GERD (gastroesophageal reflux disease)   . Anxiety   . Hyperlipidemia    Past Surgical History  Procedure Laterality Date  . Vasectomy     Family History  Problem Relation Age of Onset  . Hypertension      fhx  . Cancer      fhx  . Coronary artery disease      fhx  . Coronary artery disease Father    History  Substance Use Topics  . Smoking status: Never Smoker   . Smokeless tobacco: Never Used  . Alcohol Use: No   Review of Systems  10 Systems reviewed and all are negative for acute change except as noted in the HPI.  Allergies  Review of patient's allergies indicates no known allergies.  Home Medications   Prior to Admission medications   Medication Sig Start Date End Date Taking? Authorizing Provider  famotidine (PEPCID) 20 MG tablet Take 1 tablet (20 mg total) by mouth 2 (two) times daily. 07/14/14   Arby BarretteMarcy Tecla Mailloux, MD  lisinopril-hydrochlorothiazide (PRINZIDE,ZESTORETIC) 20-25 MG per tablet Take 1 tablet by mouth daily. 03/08/14   Nelwyn SalisburyStephen A Fry, MD  predniSONE (DELTASONE) 20 MG tablet 3 tabs po daily x 3 days, then 2 tabs x 3 days, then 1.5 tabs x 3 days, then 1 tab x 3 days, then 0.5 tabs x 3 days 07/14/14   Arby BarretteMarcy Daishon Chui, MD   Triage Vitals: BP 164/100  Pulse 84  Temp(Src)  98.3 F (36.8 C) (Oral)  Resp 18  Ht 5\' 11"  (1.803 m)  Wt 255 lb (115.667 kg)  BMI 35.58 kg/m2  SpO2 96%  Physical Exam  Nursing note and vitals reviewed. Constitutional: He is oriented to person, place, and time. He appears well-developed and well-nourished.  HENT:  Head: Normocephalic and atraumatic.  Mouth/Throat: No oropharyngeal exudate.  Objectively speaking the tongue is not currently swollen. There is a small perhaps 3 mm, flat yellow ulceration on the left lateral edge of the tongue. Other than this there are no lesions over the tongue or the posterior or..  Eyes: EOM are normal.  Pupils are equal, round, and reactive to light.  Neck: Neck supple.  Cardiovascular: Normal rate, regular rhythm, normal heart sounds and intact distal pulses.   Pulmonary/Chest: Effort normal and breath sounds normal.  Abdominal: Soft. Bowel sounds are normal. He exhibits no distension. There is no tenderness.  Musculoskeletal: Normal range of motion. He exhibits no edema.  Neurological: He is alert and oriented to person, place, and time. He has normal strength. Coordination normal. GCS eye subscore is 4. GCS verbal subscore is 5. GCS motor subscore is 6.  Skin: Skin is warm, dry and intact.  Psychiatric: He has a normal mood and affect.    ED Course  Procedures (including critical care time)  DIAGNOSTIC STUDIES: Oxygen Saturation is 96% on RA, adequate by my interpretation.    COORDINATION OF CARE: 7:13 PM- Discussed plans to give patient oral steroids and advised patient to take 50mg  of benadryl every 6 hours. Pt advised of plan for treatment and pt agrees.  Labs Review Labs Reviewed - No data to display  Imaging Review No results found.   EKG Interpretation None     MDM   Final diagnoses:  Angioedema due to seafood allergy, initial encounter  Gastroesophageal reflux disease without esophagitis   The patient's description of his symptoms it sounds consistent with angioedema. There have been no other associated allergic type reaction. Currently there is no object of swelling. The patient does describe responding to Benadryl. The symptoms may be related to a food allergy. The patient had extensive exposure to many different types of foods over the past day. He was traveling and tried many different local craft beers as well as many different right use of oysters both coke and raw. Although the medications list lisinopril as a medication the patient is not taking this. He reports he was prescribed to him for borderline hypertension but he never took it and has been trying to  manage with diet and exercise modifications, thus this cannot be associated with Ace inhibitors. The patient is counseled should there be any rebound of the symptoms he is to return. When he has had the swelling he notes he has not felt that he's had difficulty breathing or swallowing.  I personally performed the services described in this documentation, which was scribed in my presence. The recorded information has been reviewed and is accurate.  Arby BarretteMarcy Demeshia Sherburne, MD 07/15/14 [phone]

## 2014-07-14 NOTE — ED Notes (Addendum)
Patient states he woke up in the middle of the night and he woke up with his tongue swollen, has taken benadryl multiple times since last night and the swelling keeps coming back. Last dose of benadryl 50mg  was 5:30pm

## 2014-07-14 NOTE — Discharge Instructions (Signed)
Angioedema °Angioedema is a sudden swelling of tissues, often of the skin. It can occur on the face or genitals or in the abdomen or other body parts. The swelling usually develops over a short period and gets better in 24 to 48 hours. It often begins during the night and is found when the person wakes up. The person may also get red, itchy patches of skin (hives). Angioedema can be dangerous if it involves swelling of the air passages.  °Depending on the cause, episodes of angioedema may only happen once, come back in unpredictable patterns, or repeat for several years and then gradually fade away.  °CAUSES  °Angioedema can be caused by an allergic reaction to various triggers. It can also result from nonallergic causes, including reactions to drugs, immune system disorders, viral infections, or an abnormal gene that is passed to you from your parents (hereditary). For some people with angioedema, the cause is unknown.  °Some things that can trigger angioedema include:  °· Foods.   °· Medicines, such as ACE inhibitors, ARBs, nonsteroidal anti-inflammatory agents, or estrogen.   °· Latex.   °· Animal saliva.   °· Insect stings.   °· Dyes used in X-rays.   °· Mild injury.   °· Dental work. °· Surgery. °· Stress.   °· Sudden changes in temperature.   °· Exercise. °SIGNS AND SYMPTOMS  °· Swelling of the skin. °· Hives. If these are present, there is also intense itching. °· Redness in the affected area.   °· Pain in the affected area. °· Swollen lips or tongue. °· Breathing problems. This may happen if the air passages swell. °· Wheezing. °If internal organs are involved, there may be:  °· Nausea.   °· Abdominal pain.   °· Vomiting.   °· Difficulty swallowing.   °· Difficulty passing urine. °DIAGNOSIS  °· Your health care provider will examine the affected area and take a medical and family history. °· Various tests may be done to help determine the cause. Tests may include: °¨ Allergy skin tests to see if the problem  is an allergic reaction.   °¨ Blood tests to check for hereditary angioedema.   °¨ Tests to check for underlying diseases that could cause the condition.   °· A review of your medicines, including over-the-counter medicines, may be done. °TREATMENT  °Treatment will depend on the cause of the angioedema. Possible treatments include:  °· Removal of anything that triggered the condition (such as stopping certain medicines).   °· Medicines to treat symptoms or prevent attacks. Medicines given may include:   °¨ Antihistamines.   °¨ Epinephrine injection.   °¨ Steroids.   °· Hospitalization may be required for severe attacks. If the air passages are affected, it can be an emergency. Tubes may need to be placed to keep the airway open. °HOME CARE INSTRUCTIONS  °· Take all medicines as directed by your health care provider. °· If you were given medicines for emergency allergy treatment, always carry them with you. °· Wear a medical bracelet as directed by your health care provider.   °· Avoid known triggers. °SEEK MEDICAL CARE IF:  °· You have repeat attacks of angioedema.   °· Your attacks are more frequent or more severe despite preventive measures.   °· You have hereditary angioedema and are considering having children. It is important to discuss with your health care provider the risks of passing the condition on to your children. °SEEK IMMEDIATE MEDICAL CARE IF:  °· You have severe swelling of the mouth, tongue, or lips. °· You have difficulty breathing.   °· You have difficulty swallowing.   °· You faint. °MAKE   SURE YOU: °· Understand these instructions. °· Will watch your condition. °· Will get help right away if you are not doing well or get worse. °Document Released: 11/22/2001 Document Revised: 01/28/2014 Document Reviewed: 05/07/2013 °[employer]® Patient Information ©2015 [employer]. This information is not intended to replace advice given to you by your health care provider. Make sure you discuss any questions  you have with your health care provider. ° °

## 2015-05-24 ENCOUNTER — Emergency Department (INDEPENDENT_AMBULATORY_CARE_PROVIDER_SITE_OTHER)
Admission: EM | Admit: 2015-05-24 | Discharge: 2015-05-24 | Disposition: A | Discharge status: Home / Self Care | Payer: PRIVATE HEALTH INSURANCE | Source: Home / Self Care

## 2015-05-24 ENCOUNTER — Encounter (HOSPITAL_COMMUNITY): Payer: Self-pay | Admitting: Emergency Medicine

## 2015-05-24 DIAGNOSIS — S61219A Laceration without foreign body of unspecified finger without damage to nail, initial encounter: Secondary | ICD-10-CM | POA: Diagnosis not present

## 2015-05-24 DIAGNOSIS — S61209A Unspecified open wound of unspecified finger without damage to nail, initial encounter: Secondary | ICD-10-CM

## 2015-05-24 DIAGNOSIS — Z23 Encounter for immunization: Secondary | ICD-10-CM | POA: Diagnosis not present

## 2015-05-24 MED ORDER — SILVER NITRATE-POT NITRATE 75-25 % EX MISC
CUTANEOUS | Status: AC
  Filled 2015-05-24: qty 1

## 2015-05-24 MED ORDER — SULFAMETHOXAZOLE-TRIMETHOPRIM 800-160 MG PO TABS: 1.0000 | ORAL_TABLET | Freq: Two times a day (BID) | ORAL | Status: AC

## 2015-05-24 MED ORDER — TETANUS-DIPHTH-ACELL PERTUSSIS 5-2.5-18.5 LF-MCG/0.5 IM SUSP
INTRAMUSCULAR | Status: AC
  Filled 2015-05-24: qty 0.5

## 2015-05-24 MED ORDER — TETANUS-DIPHTH-ACELL PERTUSSIS 5-2.5-18.5 LF-MCG/0.5 IM SUSP
0.5000 mL | Freq: Once | INTRAMUSCULAR | Status: AC
  Administered 2015-05-24: 0.5 mL via INTRAMUSCULAR

## 2015-05-24 MED ORDER — POVIDONE-IODINE 10 % EX SOLN
CUTANEOUS | Status: AC
  Filled 2015-05-24: qty 118

## 2015-05-24 MED ORDER — OXYCODONE-ACETAMINOPHEN 5-325 MG PO TABS: 1.0000 | ORAL_TABLET | Freq: Four times a day (QID) | ORAL | Status: DC | PRN

## 2015-05-24 NOTE — Discharge Instructions (Signed)

## 2015-05-24 NOTE — ED Provider Notes (Addendum)
CSN: [id number]     Arrival date & time 05/24/15  1441 History   None    Chief Complaint  Patient presents with  . Extremity Laceration   (Consider location/radiation/quality/duration/timing/severity/associated sxs/prior Treatment) Patient is a 44 y.o. male presenting with skin laceration. The history is provided by the patient. No language interpreter was used.  Laceration Location:  Finger Finger laceration location:  R middle finger (He lacerated the tip of his right middle finger while cutting potatoes with a sharp sclicer at home about 1 hour ago) Depth:  Through underlying tissue (There was a cut through the tip of the nail, and the pulp of the finger with only a small piece of skin attaching the lacerated tip to his finger) Bleeding: controlled with pressure   Time since incident:  1 hour (about 1 hour or more ago.) Injury mechanism: Slicer. Pain details:    Quality:  Aching   Severity:  Severe   Timing:  Constant   Progression:  Unchanged Foreign body present:  No foreign bodies Relieved by:  Nothing Exacerbated by: touch. Tetanus status:  Up to date (Patient stated he had his shot 2 years ago.)   Past Medical History  Diagnosis Date  . GERD (gastroesophageal reflux disease)   . Anxiety   . Hyperlipidemia    Past Surgical History  Procedure Laterality Date  . Vasectomy     Family History  Problem Relation Age of Onset  . Hypertension      fhx  . Cancer      fhx  . Coronary artery disease      fhx  . Coronary artery disease Father    Social History  Substance Use Topics  . Smoking status: Never Smoker   . Smokeless tobacco: Never Used  . Alcohol Use: No    Review of Systems  Respiratory: Negative.   Cardiovascular: Negative.   Gastrointestinal: Negative.   Skin: Positive for wound.  All other systems reviewed and are negative.   Allergies  Review of patient's allergies indicates no known allergies.  Home Medications   Prior to Admission  medications   Medication Sig Start Date End Date Taking? Authorizing Provider  famotidine (PEPCID) 20 MG tablet Take 1 tablet (20 mg total) by mouth 2 (two) times daily. 07/14/14   Arby Barrette, MD  lisinopril-hydrochlorothiazide (PRINZIDE,ZESTORETIC) 20-25 MG per tablet Take 1 tablet by mouth daily. 03/08/14   Nelwyn Salisbury, MD  predniSONE (DELTASONE) 20 MG tablet 3 tabs po daily x 3 days, then 2 tabs x 3 days, then 1.5 tabs x 3 days, then 1 tab x 3 days, then 0.5 tabs x 3 days 07/14/14   Arby Barrette, MD   Meds Ordered and Administered this Visit  Medications - No data to display  BP 170/103 mmHg  Pulse 72  Temp(Src) 97.7 F (36.5 C) (Oral)  Resp 18  SpO2 96% No data found.   Physical Exam  Constitutional: He appears well-developed. No distress.  Cardiovascular: Normal rate and normal heart sounds.   No murmur heard. Pulmonary/Chest: Effort normal and breath sounds normal. No respiratory distress. He has no wheezes.  Musculoskeletal:       Hands: Nursing note and vitals reviewed.   ED Course  LACERATION REPAIR Date/Time: 05/24/2015 3:47 PM Performed by: Doreene Eland Authorized by: Janit Pagan T Consent: Verbal consent obtained. Risks and benefits: risks, benefits and alternatives were discussed Consent given by: patient and spouse (COnsent witnessed by Dr Bradd Canary) Patient understanding: patient states  understanding of the procedure being performed Patient consent: the patient's understanding of the procedure matches consent given Procedure consent: procedure consent matches procedure scheduled Relevant documents: relevant documents present and verified Site marked: the operative site was marked Patient identity confirmed: verbally with patient Time out: Immediately prior to procedure a "time out" was called to verify the correct patient, procedure, equipment, support staff and site/side marked as required. Location: Right middle finger tip avulsion with  small skin attachment. Wound length (cm): Complete right middle finger avulsion with a small skin attaching the avulsed pulp to the stump. Foreign bodies: no foreign bodies Tendon involvement: none Nerve involvement: none Local anesthetic: lidocaine 2% without epinephrine Patient sedated: no Irrigation solution: saline Amount of cleaning: standard Wound skin closure material used: Right middle finger pulp avulsed with nail coming off, suturing not possible. Comments: Avulsion of right middle finger tip and nail with a small skin attaching the stump to the avulsed pulp. Suturing not warranted since the tip is already falling off with a small skin attaching the avulsed finger tip to the stump. Option given to do adhesive wound dressing and giving time for the avulsed tip to fall off or snip off the avulsed tip. Patient preferred to snip it off. A small surgical scissors was use to snip off the small skin attaching the avulsed finger tip.   (including critical care time)  Labs Review Labs Reviewed - No data to display  Imaging Review No results found.   Visual Acuity Review  Right Eye Distance:   Left Eye Distance:   Bilateral Distance:    Right Eye Near:   Left Eye Near:    Bilateral Near:         MDM  Finger laceration, initial encounter  Finger avulsion, initial encounter  Right middle finger tip and nail avulsion with a small tissue attaching the avulsed tip to the stump. Unable to suture together since it was almost falling off.   Dr. Bradd Canary consulted for second opinion who agreed this can not be sutured back together.  Option given to do adhesive wound dressing and giving time for the avulsed tip to fall off or snip off the avulsed tip. Patient preferred to snip it off. A small surgical scissors was use to snip off the small skin attaching the avulsed finger tip.  Procedure was complicated by bleeding but hemostat was achieved with surgicel and a compressive  dressing was placed. Bactrim DS coverage given and percocet prn pain. Follow up with PCP next week for wound check.  No record of Tdap although he stated he got some vaccination recently. Tdap was given during this visit.     Doreene Eland, MD 05/24/15 1805  Doreene Eland, MD 05/26/15 1610  Doreene Eland, MD 05/27/15 [id number]

## 2015-05-24 NOTE — ED Notes (Signed)
Reports laceration to right middle finger onset 30-40 minutes ago while cutting potatoes Cut right through fingernail and skin Last tetanus = w/in a year Pt has finger in betadine solution per VO from dr. Lum Babe Alert... No acute distress.

## 2015-09-30 ENCOUNTER — Encounter: Payer: Self-pay | Admitting: Family Medicine

## 2015-09-30 ENCOUNTER — Ambulatory Visit (INDEPENDENT_AMBULATORY_CARE_PROVIDER_SITE_OTHER): Payer: PRIVATE HEALTH INSURANCE | Admitting: Family Medicine

## 2015-09-30 VITALS — BP 160/100 | HR 73 | Wt 269.8 lb

## 2015-09-30 DIAGNOSIS — J209 Acute bronchitis, unspecified: Secondary | ICD-10-CM | POA: Diagnosis not present

## 2015-09-30 MED ORDER — HYDROCODONE-HOMATROPINE 5-1.5 MG/5ML PO SYRP: 5.0000 mL | ORAL_SOLUTION | ORAL | Status: DC | PRN

## 2015-09-30 MED ORDER — AZITHROMYCIN 250 MG PO TABS: ORAL_TABLET | ORAL | Status: DC

## 2015-09-30 NOTE — Progress Notes (Signed)
Pre visit review using our clinic review tool, if applicable. No additional management support is needed unless otherwise documented below in the visit note. 

## 2015-09-30 NOTE — Progress Notes (Signed)
   Subjective:    Patient ID: Daniel Rodriguez, male    DOB: 01/12/1971, 45 y.o.   MRN: [id number]  HPI Here for one week of PND, chest tightness and coughing up yellow sputum. No fever.    Review of Systems  Constitutional: Negative.   HENT: Positive for congestion and postnasal drip. Negative for ear pain, sinus pressure and sore throat.   Eyes: Negative.   Respiratory: Positive for cough and chest tightness.        Objective:   Physical Exam  Constitutional: He appears well-developed and well-nourished.  HENT:  Right Ear: External ear normal.  Left Ear: External ear normal.  Nose: Nose normal.  Mouth/Throat: Oropharynx is clear and moist.  Eyes: Conjunctivae are normal.  Neck: No thyromegaly present.  Pulmonary/Chest: Effort normal. No respiratory distress. He has no wheezes. He has no rales.  Scattered rhonchi   Lymphadenopathy:    He has no cervical adenopathy.          Assessment & Plan:  Bronchitis, treat with a Zpack.

## 2016-12-22 ENCOUNTER — Ambulatory Visit (INDEPENDENT_AMBULATORY_CARE_PROVIDER_SITE_OTHER): Payer: PRIVATE HEALTH INSURANCE | Admitting: Family Medicine

## 2016-12-22 ENCOUNTER — Encounter: Payer: Self-pay | Admitting: Family Medicine

## 2016-12-22 VITALS — BP 150/98 | HR 75 | Temp 98.1°F | Ht 71.0 in | Wt 285.0 lb

## 2016-12-22 DIAGNOSIS — G4730 Sleep apnea, unspecified: Secondary | ICD-10-CM | POA: Insufficient documentation

## 2016-12-22 DIAGNOSIS — I10 Essential (primary) hypertension: Secondary | ICD-10-CM

## 2016-12-22 DIAGNOSIS — G4733 Obstructive sleep apnea (adult) (pediatric): Secondary | ICD-10-CM

## 2016-12-22 MED ORDER — LISINOPRIL-HYDROCHLOROTHIAZIDE 20-25 MG PO TABS: 1.0000 | ORAL_TABLET | Freq: Every day | ORAL | 3 refills | Status: DC

## 2016-12-22 NOTE — Progress Notes (Signed)
   Subjective:    Patient ID: Daniel Rodriguez, male    DOB: 03/17/1971, 46 y.o.   MRN: [id number]  HPI Here to discuss a number of issues. First he has felt tired all the time lately, he gets mildly SOB going up steps, and he as trouble sleeping. He knows he is overweight and out of shape, and his BP has been high. We prescribed him a BP medication 3 years ago but he never took it. He got serious about diet and exercise then and the BP came down a bit. However he got off the diet and stopped exericising, and he gained a lot of weight. Also he wakes up a lot at night and his wife says he seems to stop breathing periodically and he snores.    Review of Systems  Constitutional: Positive for fatigue.  Respiratory: Positive for shortness of breath. Negative for cough, chest tightness and wheezing.   Cardiovascular: Negative.   Gastrointestinal: Negative.   Neurological: Negative.        Objective:   Physical Exam  Constitutional: He is oriented to person, place, and time.  Morbidly obese   Neck: No thyromegaly present.  Cardiovascular: Normal rate, regular rhythm, normal heart sounds and intact distal pulses.   Pulmonary/Chest: Effort normal and breath sounds normal. No respiratory distress. He has no wheezes. He has no rales.  Musculoskeletal: He exhibits no edema.  Lymphadenopathy:    He has no cervical adenopathy.  Neurological: He is alert and oriented to person, place, and time.          Assessment & Plan:  I agree he is overweight and out of shape. He will get back into a diet and exercise program. In the meantime he will start taking Lisinopril HCT, and we will recheck in 3-4 weeks. At that time we will do a full wellness exam to include lab work and an EKG. He likely has sleep apnea. So we will refer for a sleep study. Gershon CraneStephen Lavonya Hoerner, MD

## 2016-12-22 NOTE — Patient Instructions (Signed)
WE NOW OFFER   [facility] FAST TRACK!!!  SAME DAY Appointments for ACUTE CARE  Such as: Sprains, Injuries, cuts, abrasions, rashes, muscle pain, joint pain, back pain Colds, flu, sore throats, headache, allergies, cough, fever  Ear pain, sinus and eye infections Abdominal pain, nausea, vomiting, diarrhea, upset stomach Animal/insect bites  3 Easy Ways to Schedule: Walk-In Scheduling Call in scheduling Mychart Sign-up: [email or web address]         

## 2016-12-22 NOTE — Progress Notes (Signed)
Pre visit review using our clinic review tool, if applicable. No additional management support is needed unless otherwise documented below in the visit note. 

## 2017-01-21 ENCOUNTER — Ambulatory Visit (INDEPENDENT_AMBULATORY_CARE_PROVIDER_SITE_OTHER): Payer: PRIVATE HEALTH INSURANCE | Admitting: Family Medicine

## 2017-01-21 ENCOUNTER — Encounter: Payer: Self-pay | Admitting: Family Medicine

## 2017-01-21 VITALS — BP 120/80 | Temp 98.6°F | Ht 71.0 in | Wt 285.0 lb

## 2017-01-21 DIAGNOSIS — I10 Essential (primary) hypertension: Secondary | ICD-10-CM

## 2017-01-21 DIAGNOSIS — G4733 Obstructive sleep apnea (adult) (pediatric): Secondary | ICD-10-CM | POA: Diagnosis not present

## 2017-01-21 DIAGNOSIS — Z0000 Encounter for general adult medical examination without abnormal findings: Secondary | ICD-10-CM

## 2017-01-21 LAB — LIPID PANEL
CHOL/HDL RATIO: 6
CHOLESTEROL: 172 mg/dL (ref 0–200)
HDL: 28.5 mg/dL — AB (ref >39.00)
NonHDL: 143.86
Triglycerides: 234 mg/dL — ABNORMAL HIGH (ref 0.0–149.0)
VLDL: 46.8 mg/dL — AB (ref 0.0–40.0)

## 2017-01-21 LAB — CBC WITH DIFFERENTIAL/PLATELET
BASOS ABS: 0 10*3/uL (ref 0.0–0.1)
Basophils Relative: 0.4 % (ref 0.0–3.0)
EOS ABS: 0.3 10*3/uL (ref 0.0–0.7)
Eosinophils Relative: 4 % (ref 0.0–5.0)
HEMATOCRIT: 45.2 % (ref 39.0–52.0)
Hemoglobin: 15.4 g/dL (ref 13.0–17.0)
LYMPHS PCT: 26.6 % (ref 12.0–46.0)
Lymphs Abs: 2.1 10*3/uL (ref 0.7–4.0)
MCHC: 34.1 g/dL (ref 30.0–36.0)
MCV: 88.1 fl (ref 78.0–100.0)
Monocytes Absolute: 0.5 10*3/uL (ref 0.1–1.0)
Monocytes Relative: 6.3 % (ref 3.0–12.0)
NEUTROS ABS: 5 10*3/uL (ref 1.4–7.7)
Neutrophils Relative %: 62.7 % (ref 43.0–77.0)
PLATELETS: 238 10*3/uL (ref 150.0–400.0)
RBC: 5.13 Mil/uL (ref 4.22–5.81)
RDW: 13.3 % (ref 11.5–15.5)
WBC: 8 10*3/uL (ref 4.0–10.5)

## 2017-01-21 LAB — POC URINALSYSI DIPSTICK (AUTOMATED)
BILIRUBIN UA: NEGATIVE
Glucose, UA: NEGATIVE
Ketones, UA: NEGATIVE
Leukocytes, UA: NEGATIVE
NITRITE UA: NEGATIVE
PH UA: 6 (ref 5.0–8.0)
PROTEIN UA: NEGATIVE
RBC UA: NEGATIVE
Spec Grav, UA: >=1.03 — AB (ref 1.010–1.025)
UROBILINOGEN UA: 0.2 U/dL

## 2017-01-21 LAB — HEPATIC FUNCTION PANEL
ALBUMIN: 4 g/dL (ref 3.5–5.2)
ALT: 30 U/L (ref 0–53)
AST: 21 U/L (ref 0–37)
Alkaline Phosphatase: 104 U/L (ref 39–117)
Bilirubin, Direct: 0.1 mg/dL (ref 0.0–0.3)
Total Bilirubin: 0.6 mg/dL (ref 0.2–1.2)
Total Protein: 6.4 g/dL (ref 6.0–8.3)

## 2017-01-21 LAB — BASIC METABOLIC PANEL
BUN: 16 mg/dL (ref 6–23)
CALCIUM: 8.7 mg/dL (ref 8.4–10.5)
CO2: 30 mEq/L (ref 19–32)
CREATININE: 0.97 mg/dL (ref 0.40–1.50)
Chloride: 106 mEq/L (ref 96–112)
GFR: 87.78 mL/min (ref >60.00)
GLUCOSE: 86 mg/dL (ref 70–99)
Potassium: 4.6 mEq/L (ref 3.5–5.1)
Sodium: 141 mEq/L (ref 135–145)

## 2017-01-21 LAB — LDL CHOLESTEROL, DIRECT: Direct LDL: 105 mg/dL

## 2017-01-21 LAB — TSH: TSH: 1.81 u[IU]/mL (ref 0.35–4.50)

## 2017-01-21 NOTE — Progress Notes (Signed)
   Subjective:    Patient ID: Daniel Rodriguez, male    DOB: 01/01/1971, 46 y.o.   MRN: [id number]  HPI 46 yr old male for a well exam. He feels fine today. We saw him recently for HTN and we started him on Lisinopril HCT. The BP has come down nicely. He has made some improvements in his diet.    Review of Systems  Constitutional: Negative.   HENT: Negative.   Eyes: Negative.   Respiratory: Negative.   Cardiovascular: Negative.   Gastrointestinal: Negative.   Genitourinary: Negative.   Musculoskeletal: Negative.   Skin: Negative.   Neurological: Negative.   Psychiatric/Behavioral: Negative.        Objective:   Physical Exam  Constitutional: He is oriented to person, place, and time. No distress.  Morbidly obese   HENT:  Head: Normocephalic and atraumatic.  Right Ear: External ear normal.  Left Ear: External ear normal.  Nose: Nose normal.  Mouth/Throat: Oropharynx is clear and moist. No oropharyngeal exudate.  Eyes: Conjunctivae and EOM are normal. Pupils are equal, round, and reactive to light. Right eye exhibits no discharge. Left eye exhibits no discharge. No scleral icterus.  Neck: Neck supple. No JVD present. No tracheal deviation present. No thyromegaly present.  Cardiovascular: Normal rate, regular rhythm, normal heart sounds and intact distal pulses.  Exam reveals no gallop and no friction rub.   No murmur heard. EKG is within normal limits   Pulmonary/Chest: Effort normal and breath sounds normal. No respiratory distress. He has no wheezes. He has no rales. He exhibits no tenderness.  Abdominal: Soft. Bowel sounds are normal. He exhibits no distension and no mass. There is no tenderness. There is no rebound and no guarding.  Genitourinary: Penis normal. No penile tenderness.  Musculoskeletal: Normal range of motion. He exhibits no edema or tenderness.  Lymphadenopathy:    He has no cervical adenopathy.  Neurological: He is alert and oriented to person, place, and time.  He has normal reflexes. No cranial nerve deficit. He exhibits normal muscle tone. Coordination normal.  Skin: Skin is warm and dry. No rash noted. He is not diaphoretic. No erythema. No pallor.  Psychiatric: He has a normal mood and affect. His behavior is normal. Judgment and thought content normal.          Assessment & Plan:  Well exam. We discussed diet and exercise. Get fasting labs. Continue on the Lisinopril HCT.  Gershon Crane, MD

## 2017-01-21 NOTE — Progress Notes (Signed)
Pre visit review using our clinic review tool, if applicable. No additional management support is needed unless otherwise documented below in the visit note. 

## 2017-01-21 NOTE — Patient Instructions (Signed)
WE NOW OFFER   [facility] FAST TRACK!!!  SAME DAY Appointments for ACUTE CARE  Such as: Sprains, Injuries, cuts, abrasions, rashes, muscle pain, joint pain, back pain Colds, flu, sore throats, headache, allergies, cough, fever  Ear pain, sinus and eye infections Abdominal pain, nausea, vomiting, diarrhea, upset stomach Animal/insect bites  3 Easy Ways to Schedule: Walk-In Scheduling Call in scheduling Mychart Sign-up: [email or web address]         

## 2017-03-09 ENCOUNTER — Telehealth: Payer: Self-pay | Admitting: Family Medicine

## 2017-03-09 NOTE — Telephone Encounter (Signed)
Stop this and call in Losartan HCT 50-12.5 daily, #30 with 5 rf

## 2017-03-09 NOTE — Telephone Encounter (Signed)
Pt states he may be having side effects form his lisinopril-hydrochlorothiazide (PRINZIDE,ZESTORETIC) 20-25 MG tablet  Pt states having at least one bad episode of coughing a day, and on and off coughing throughout the day Pt states the med is working, bu t wondering if he should try something else.  [id number] [place], [place] - [address]

## 2017-03-10 MED ORDER — LOSARTAN POTASSIUM-HCTZ 50-12.5 MG PO TABS: 1.0000 | ORAL_TABLET | Freq: Every day | ORAL | 5 refills | Status: DC

## 2017-03-10 NOTE — Telephone Encounter (Signed)
I spoke with pt, [facility] and removed Lisinopril-HCTZ from current medication list,also added to drug allergy list.

## 2017-05-17 ENCOUNTER — Institutional Professional Consult (permissible substitution): Payer: PRIVATE HEALTH INSURANCE | Admitting: Internal Medicine

## 2017-11-11 ENCOUNTER — Telehealth: Payer: Self-pay | Admitting: *Deleted

## 2017-11-11 DIAGNOSIS — K649 Unspecified hemorrhoids: Secondary | ICD-10-CM

## 2017-11-11 NOTE — Telephone Encounter (Signed)
The referral was done  

## 2017-11-11 NOTE — Telephone Encounter (Signed)
Copied from CRM [id number]. Topic: Referral - Request >> Nov 09, 2017  4:02 PM Trula SladeWalter, Linda F wrote: Reason for CRM:  Patient stated he needs a referral for a Proctologist.  Patient is still having problems with hemorrhoids.

## 2018-01-24 ENCOUNTER — Encounter: Payer: Self-pay | Admitting: Family Medicine

## 2018-01-24 ENCOUNTER — Ambulatory Visit (INDEPENDENT_AMBULATORY_CARE_PROVIDER_SITE_OTHER): Payer: PRIVATE HEALTH INSURANCE | Admitting: Family Medicine

## 2018-01-24 VITALS — BP 124/80 | HR 80 | Temp 98.0°F | Ht 70.25 in | Wt 292.0 lb

## 2018-01-24 DIAGNOSIS — Z0000 Encounter for general adult medical examination without abnormal findings: Secondary | ICD-10-CM

## 2018-01-24 LAB — CBC WITH DIFFERENTIAL/PLATELET
BASOS ABS: 0 10*3/uL (ref 0.0–0.1)
Basophils Relative: 0.5 % (ref 0.0–3.0)
Eosinophils Absolute: 0.3 10*3/uL (ref 0.0–0.7)
Eosinophils Relative: 4.3 % (ref 0.0–5.0)
HEMATOCRIT: 45.8 % (ref 39.0–52.0)
HEMOGLOBIN: 16.1 g/dL (ref 13.0–17.0)
LYMPHS PCT: 24.2 % (ref 12.0–46.0)
Lymphs Abs: 1.9 10*3/uL (ref 0.7–4.0)
MCHC: 35.1 g/dL (ref 30.0–36.0)
MCV: 86.3 fl (ref 78.0–100.0)
MONOS PCT: 7.2 % (ref 3.0–12.0)
Monocytes Absolute: 0.6 10*3/uL (ref 0.1–1.0)
Neutro Abs: 5.1 10*3/uL (ref 1.4–7.7)
Neutrophils Relative %: 63.8 % (ref 43.0–77.0)
PLATELETS: 229 10*3/uL (ref 150.0–400.0)
RBC: 5.31 Mil/uL (ref 4.22–5.81)
RDW: 13.8 % (ref 11.5–15.5)
WBC: 8 10*3/uL (ref 4.0–10.5)

## 2018-01-24 LAB — POC URINALSYSI DIPSTICK (AUTOMATED)
Bilirubin, UA: NEGATIVE
Blood, UA: NEGATIVE
Glucose, UA: NEGATIVE
Ketones, UA: NEGATIVE
LEUKOCYTES UA: NEGATIVE
NITRITE UA: NEGATIVE
PH UA: 6 (ref 5.0–8.0)
Spec Grav, UA: >=1.03 — AB (ref 1.010–1.025)
Urobilinogen, UA: 0.2 E.U./dL

## 2018-01-24 LAB — BASIC METABOLIC PANEL
BUN: 18 mg/dL (ref 6–23)
CO2: 29 mEq/L (ref 19–32)
CREATININE: 0.92 mg/dL (ref 0.40–1.50)
Calcium: 8.9 mg/dL (ref 8.4–10.5)
Chloride: 104 mEq/L (ref 96–112)
GFR: 92.91 mL/min (ref >60.00)
Glucose, Bld: 85 mg/dL (ref 70–99)
POTASSIUM: 4.3 meq/L (ref 3.5–5.1)
Sodium: 141 mEq/L (ref 135–145)

## 2018-01-24 LAB — LIPID PANEL
CHOL/HDL RATIO: 5
Cholesterol: 156 mg/dL (ref 0–200)
HDL: 31.7 mg/dL — ABNORMAL LOW (ref >39.00)
LDL CALC: 98 mg/dL (ref 0–99)
NONHDL: 124.07
TRIGLYCERIDES: 129 mg/dL (ref 0.0–149.0)
VLDL: 25.8 mg/dL (ref 0.0–40.0)

## 2018-01-24 LAB — HEPATIC FUNCTION PANEL
ALT: 40 U/L (ref 0–53)
AST: 25 U/L (ref 0–37)
Albumin: 4 g/dL (ref 3.5–5.2)
Alkaline Phosphatase: 103 U/L (ref 39–117)
BILIRUBIN DIRECT: 0.1 mg/dL (ref 0.0–0.3)
BILIRUBIN TOTAL: 0.7 mg/dL (ref 0.2–1.2)
Total Protein: 6.5 g/dL (ref 6.0–8.3)

## 2018-01-24 LAB — TSH: TSH: 2.41 u[IU]/mL (ref 0.35–4.50)

## 2018-01-24 MED ORDER — LOSARTAN POTASSIUM-HCTZ 50-12.5 MG PO TABS: 1.0000 | ORAL_TABLET | Freq: Every day | ORAL | 11 refills | Status: DC

## 2018-01-24 NOTE — Progress Notes (Signed)
   Subjective:    Patient ID: Daniel Rodriguez, male    DOB: Oct 17, 1970, 47 y.o.   MRN: [id number]  HPI Here for a well exam. He feels fine but he knows he is overweight. He tries to eat a healthy diet but he gets very little exercise.    Review of Systems  Constitutional: Negative.   HENT: Negative.   Eyes: Negative.   Respiratory: Negative.   Cardiovascular: Negative.   Gastrointestinal: Negative.   Genitourinary: Negative.   Musculoskeletal: Negative.   Skin: Negative.   Neurological: Negative.   Psychiatric/Behavioral: Negative.        Objective:   Physical Exam  Constitutional: He is oriented to person, place, and time. No distress.  Morbidly obese   HENT:  Head: Normocephalic and atraumatic.  Right Ear: External ear normal.  Left Ear: External ear normal.  Nose: Nose normal.  Mouth/Throat: Oropharynx is clear and moist. No oropharyngeal exudate.  Eyes: Pupils are equal, round, and reactive to light. Conjunctivae and EOM are normal. Right eye exhibits no discharge. Left eye exhibits no discharge. No scleral icterus.  Neck: Neck supple. No JVD present. No tracheal deviation present. No thyromegaly present.  Cardiovascular: Normal rate, regular rhythm, normal heart sounds and intact distal pulses. Exam reveals no gallop and no friction rub.  No murmur heard. Pulmonary/Chest: Effort normal and breath sounds normal. No respiratory distress. He has no wheezes. He has no rales. He exhibits no tenderness.  Abdominal: Soft. Bowel sounds are normal. He exhibits no distension and no mass. There is no tenderness. There is no rebound and no guarding.  Genitourinary: Rectum normal and penis normal. No penile tenderness.  Musculoskeletal: Normal range of motion. He exhibits no edema or tenderness.  Lymphadenopathy:    He has no cervical adenopathy.  Neurological: He is alert and oriented to person, place, and time. He has normal reflexes. He displays normal reflexes. No cranial nerve  deficit. He exhibits normal muscle tone. Coordination normal.  Skin: Skin is warm and dry. No rash noted. He is not diaphoretic. No erythema. No pallor.  Psychiatric: He has a normal mood and affect. His behavior is normal. Judgment and thought content normal.          Assessment & Plan:  Well exam. We discussed diet and exercise. Get fasting labs.  Gershon Crane, MD

## 2018-02-13 ENCOUNTER — Other Ambulatory Visit: Payer: Self-pay | Admitting: Family Medicine

## 2018-10-05 ENCOUNTER — Encounter: Payer: Self-pay | Admitting: Family Medicine

## 2018-10-05 ENCOUNTER — Ambulatory Visit: Payer: PRIVATE HEALTH INSURANCE | Admitting: Family Medicine

## 2018-10-05 VITALS — BP 142/94 | HR 80 | Temp 98.1°F | Wt 297.0 lb

## 2018-10-05 DIAGNOSIS — I10 Essential (primary) hypertension: Secondary | ICD-10-CM

## 2018-10-05 DIAGNOSIS — J020 Streptococcal pharyngitis: Secondary | ICD-10-CM

## 2018-10-05 DIAGNOSIS — H1032 Unspecified acute conjunctivitis, left eye: Secondary | ICD-10-CM | POA: Diagnosis not present

## 2018-10-05 DIAGNOSIS — J029 Acute pharyngitis, unspecified: Secondary | ICD-10-CM | POA: Diagnosis not present

## 2018-10-05 LAB — POCT RAPID STREP A (OFFICE): RAPID STREP A SCREEN: POSITIVE — AB

## 2018-10-05 MED ORDER — POLYMYXIN B-TRIMETHOPRIM 10000-0.1 UNIT/ML-% OP SOLN: 1.0000 [drp] | OPHTHALMIC | 0 refills | Status: AC

## 2018-10-05 MED ORDER — IRBESARTAN 75 MG PO TABS: 75.0000 mg | ORAL_TABLET | Freq: Every day | ORAL | 0 refills | Status: DC

## 2018-10-05 MED ORDER — AMOXICILLIN 500 MG PO CAPS: 500.0000 mg | ORAL_CAPSULE | Freq: Two times a day (BID) | ORAL | 0 refills | Status: AC

## 2018-10-05 NOTE — Patient Instructions (Signed)
Strep Throat  Strep throat is a bacterial infection of the throat. Your health care provider may call the infection tonsillitis or pharyngitis, depending on whether there is swelling in the tonsils or at the back of the throat. Strep throat is most common during the cold months of the year in children who are 76-50 years of age, but it can happen during any season in people of any age. This infection is spread from person to person (contagious) through coughing, sneezing, or close contact. What are the causes? Strep throat is caused by the bacteria called Streptococcus pyogenes. What increases the risk? This condition is more likely to develop in:  People who spend time in crowded places where the infection can spread easily.  People who have close contact with someone who has strep throat. What are the signs or symptoms? Symptoms of this condition include:  Fever or chills.  Redness, swelling, or pain in the tonsils or throat.  Pain or difficulty when swallowing.  White or yellow spots on the tonsils or throat.  Swollen, tender glands in the neck or under the jaw.  Red rash all over the body (rare). How is this diagnosed? This condition is diagnosed by performing a rapid strep test or by taking a swab of your throat (throat culture test). Results from a rapid strep test are usually ready in a few minutes, but throat culture test results are available after one or two days. How is this treated? This condition is treated with antibiotic medicine. Follow these instructions at home: Medicines  Take over-the-counter and prescription medicines only as told by your health care provider.  Take your antibiotic as told by your health care provider. Do not stop taking the antibiotic even if you start to feel better.  Have family members who also have a sore throat or fever tested for strep throat. They may need antibiotics if they have the strep infection. Eating and drinking  Do not  share food, drinking cups, or personal items that could cause the infection to spread to other people.  If swallowing is difficult, try eating soft foods until your sore throat feels better.  Drink enough fluid to keep your urine clear or pale yellow. General instructions  Gargle with a salt-water mixture 3-4 times per day or as needed. To make a salt-water mixture, completely dissolve -1 tsp of salt in 1 cup of warm water.  Make sure that all household members wash their hands well.  Get plenty of rest.  Stay home from school or work until you have been taking antibiotics for 24 hours.  Keep all follow-up visits as told by your health care provider. This is important. Contact a health care provider if:  The glands in your neck continue to get bigger.  You develop a rash, cough, or earache.  You cough up a thick liquid that is green, yellow-brown, or bloody.  You have pain or discomfort that does not get better with medicine.  Your problems seem to be getting worse rather than better.  You have a fever. Get help right away if:  You have new symptoms, such as vomiting, severe headache, stiff or painful neck, chest pain, or shortness of breath.  You have severe throat pain, drooling, or changes in your voice.  You have swelling of the neck, or the skin on the neck becomes red and tender.  You have signs of dehydration, such as fatigue, dry mouth, and decreased urination.  You become increasingly sleepy, or you  cannot wake up completely.  Your joints become red or painful. This information is not intended to replace advice given to you by your health care provider. Make sure you discuss any questions you have with your health care provider. Document Released: 09/10/2000 Document Revised: 05/12/2016 Document Reviewed: 01/06/2015 Elsevier Interactive Patient Education  2019 Elsevier Inc.  Bacterial Conjunctivitis, Adult Bacterial conjunctivitis is an infection of the clear  membrane that covers the white part of your eye and the inner surface of your eyelid (conjunctiva). When the blood vessels in your conjunctiva become inflamed, your eye becomes red or pink, and it will probably feel itchy. Bacterial conjunctivitis spreads very easily from person to person (is contagious). It also spreads easily from one eye to the other eye. What are the causes? This condition is caused by bacteria. You may get the infection if you come into close contact with:  A person who is infected with the bacteria.  Items that are contaminated with the bacteria, such as a face towel, contact lens solution, or eye makeup. What increases the risk? You are more likely to develop this condition if you:  Are exposed to other people who have the infection.  Wear contact lenses.  Have a sinus infection.  Have had a recent eye injury or surgery.  Have a weak body defense system (immune system).  Have a medical condition that causes dry eyes. What are the signs or symptoms? Symptoms of this condition include:  Thick, yellowish discharge from the eye. This may turn into a crust on the eyelid overnight and cause your eyelids to stick together.  Tearing or watery eyes.  Itchy eyes.  Burning feeling in your eyes.  Eye redness.  Swollen eyelids.  Blurred vision. How is this diagnosed? This condition is diagnosed based on your symptoms and medical history. Your health care provider may also take a sample of discharge from your eye to find the cause of your infection. This is rarely done. How is this treated? This condition may be treated with:  Antibiotic eye drops or ointment to clear the infection more quickly and prevent the spread of infection to others.  Oral antibiotic medicines to treat infections that do not respond to drops or ointments or that last longer than 10 days.  Cool, wet cloths (cool compresses) placed on the eyes.  Artificial tears applied 2-6 times a  day. Follow these instructions at home: Medicines  Take or apply your antibiotic medicine as told by your health care provider. Do not stop taking or applying the antibiotic even if you start to feel better.  Take or apply over-the-counter and prescription medicines only as told by your health care provider.  Be very careful to avoid touching the edge of your eyelid with the eye-drop bottle or the ointment tube when you apply medicines to the affected eye. This will keep you from spreading the infection to your other eye or to other people. Managing discomfort  Gently wipe away any drainage from your eye with a warm, wet washcloth or a cotton ball.  Apply a clean, cool compress to your eye for 10-20 minutes, 3-4 times a day. General instructions  Do not wear contact lenses until the inflammation is gone and your health care provider says it is safe to wear them again. Ask your health care provider how to sterilize or replace your contact lenses before you use them again. Wear glasses until you can resume wearing contact lenses.  Avoid wearing eye makeup until the  inflammation is gone. Throw away any old eye cosmetics that may be contaminated.  Change or wash your pillowcase every day.  Do not share towels or washcloths. This may spread the infection.  Wash your hands often with soap and water. Use paper towels to dry your hands.  Avoid touching or rubbing your eyes.  Do not drive or use heavy machinery if your vision is blurred. Contact a health care provider if:  You have a fever.  Your symptoms do not get better after 10 days. Get help right away if you have:  A fever and your symptoms suddenly get worse.  Severe pain when you move your eye.  Facial pain, redness, or swelling.  Sudden loss of vision. Summary  Bacterial conjunctivitis is an infection of the clear membrane that covers the white part of your eye and the inner surface of your eyelid  (conjunctiva).  Bacterial conjunctivitis spreads very easily from person to person (is contagious).  Wash your hands often with soap and water. Use paper towels to dry your hands.  Take or apply your antibiotic medicine as told by your health care provider. Do not stop taking or applying the antibiotic even if you start to feel better.  Contact a health care provider if you have a fever or your symptoms do not get better after 10 days. This information is not intended to replace advice given to you by your health care provider. Make sure you discuss any questions you have with your health care provider. Document Released: 09/13/2005 Document Revised: 04/19/2018 Document Reviewed: 04/19/2018 Elsevier Interactive Patient Education  2019 [employer]

## 2018-10-05 NOTE — Progress Notes (Signed)
Subjective:    Patient ID: Daniel Rodriguez, male    DOB: 01/05/1971, 48 y.o.   MRN: [id number]  No chief complaint on file.   HPI Patient was seen today for acute concern.  Pt endorses nasal congestion, cough, and drainage in throat on Monday.  By last night patient developed "bad" sore throat and puffy/red left eye.  Pt also notes some ear discomfort.  Throat was so painful last night patient considered going to the ED.  Pt denies fever, chills, nausea, vomiting, facial pain or pressure.  Pt tried Mucinex, NyQuil, DayQuil for his symptoms.  Possible sick contacts include family members and people at work.     When asked about bp, pt states he stopped taking losartan-HCTZ 50-12.5 mg given all the issues with losartan being recalled.  Past Medical History:  Diagnosis Date  . Anxiety   . GERD (gastroesophageal reflux disease)   . Hyperlipidemia     Allergies  Allergen Reactions  . Lisinopril Cough    ROS General: Denies fever, chills, night sweats, changes in weight, changes in appetite HEENT: Denies headaches, ear pain, changes in vision, rhinorrhea  + nasal drainage, nasal congestion, ear pressure, swollen red left eye sore throat CV: Denies CP, palpitations, SOB, orthopnea Pulm: Denies SOB, wheezing  + cough GI: Denies abdominal pain, nausea, vomiting, diarrhea, constipation GU: Denies dysuria, hematuria, frequency, vaginal discharge Msk: Denies muscle cramps, joint pains Neuro: Denies weakness, numbness, tingling Skin: Denies rashes, bruising Psych: Denies depression, anxiety, hallucinations    Objective:    Blood pressure (!) 142/94, pulse 80, temperature 98.1 F (36.7 C), temperature source Oral, weight 297 lb (134.7 kg), SpO2 96 %.  Gen. Pleasant, well-nourished, in no distress, normal affect  HEENT: [place]/AT, face symmetric, conjunctival injection, no scleral icterus, PERRLA, nares patent without drainage, pharynx with erythema, no exudate.  TMs full.  Mild cervical  lymphadenopathy. Lungs: no accessory muscle use, CTAB, no wheezes or rales Cardiovascular: RRR, no m/r/g, no peripheral edema Neuro:  A&Ox3, CN II-XII intact, normal gait Skin:  Warm, no lesions/ rash   Wt Readings from Last 3 Encounters:  10/05/18 297 lb (134.7 kg)  01/24/18 292 lb (132.5 kg)  01/21/17 285 lb (129.3 kg)    Lab Results  Component Value Date   WBC 8.0 01/24/2018   HGB 16.1 01/24/2018   HCT 45.8 01/24/2018   PLT 229.0 01/24/2018   GLUCOSE 85 01/24/2018   CHOL 156 01/24/2018   TRIG 129.0 01/24/2018   HDL 31.70 (L) 01/24/2018   LDLDIRECT 105.0 01/21/2017   LDLCALC 98 01/24/2018   ALT 40 01/24/2018   AST 25 01/24/2018   NA 141 01/24/2018   K 4.3 01/24/2018   CL 104 01/24/2018   CREATININE 0.92 01/24/2018   BUN 18 01/24/2018   CO2 29 01/24/2018   TSH 2.41 01/24/2018    Assessment/Plan:  Strep pharyngitis  -given handout - Plan: amoxicillin (AMOXIL) 500 MG capsule -f/u prn for worsening or continued symptoms  Acute conjunctivitis of left eye, unspecified acute conjunctivitis type  -given handout - Plan: trimethoprim-polymyxin b (POLYTRIM) ophthalmic solution  Essential hypertension  -uncontrolled -discussed trying another medication in the same class as losartan given concerns over losartan recalls -lifestyle modifications encouraged - Plan: irbesartan (AVAPRO) 75 MG tablet  Sore throat  - Plan: POCT rapid strep A---  Positive  F/u in 1 month for HTN with your pcp.  Abbe Amsterdam, MD

## 2018-10-06 ENCOUNTER — Telehealth: Payer: Self-pay

## 2018-10-06 NOTE — Telephone Encounter (Signed)
Copied from CRM [id number]. Topic: General - Inquiry >> Oct 06, 2018 12:39 PM Windy Kalata, NT wrote: Reason for CRM: patient is calling and was seen 10/05/18 by Dr. Salomon Fick and forgot to request cough medicine. Please advise.  CB# [phone]  [id number] [place], [place] - [address] AT [facility] OF [facility] [address] [place] [address] Phone: [phone] Fax: [phone]

## 2018-10-09 NOTE — Telephone Encounter (Signed)
Please Advise

## 2018-10-10 ENCOUNTER — Encounter: Payer: Self-pay | Admitting: Family Medicine

## 2018-10-10 MED ORDER — BENZONATATE 100 MG PO CAPS: 100.0000 mg | ORAL_CAPSULE | Freq: Two times a day (BID) | ORAL | 0 refills | Status: DC | PRN

## 2018-10-10 NOTE — Telephone Encounter (Signed)
Tessalon sent to pharmacy

## 2018-10-10 NOTE — Telephone Encounter (Signed)
Called pt left a detailed message regarding his request for cough medication

## 2018-10-17 ENCOUNTER — Encounter: Payer: Self-pay | Admitting: Family Medicine

## 2018-10-17 ENCOUNTER — Ambulatory Visit: Payer: PRIVATE HEALTH INSURANCE | Admitting: Family Medicine

## 2018-10-17 VITALS — BP 142/88 | HR 82 | Temp 97.8°F | Wt 300.2 lb

## 2018-10-17 DIAGNOSIS — R05 Cough: Secondary | ICD-10-CM

## 2018-10-17 DIAGNOSIS — J069 Acute upper respiratory infection, unspecified: Secondary | ICD-10-CM

## 2018-10-17 DIAGNOSIS — R059 Cough, unspecified: Secondary | ICD-10-CM

## 2018-10-17 LAB — POCT INFLUENZA A/B
INFLUENZA A, POC: NEGATIVE
INFLUENZA B, POC: NEGATIVE

## 2018-10-17 MED ORDER — HYDROCODONE-HOMATROPINE 5-1.5 MG/5ML PO SYRP: 5.0000 mL | ORAL_SOLUTION | ORAL | 0 refills | Status: DC | PRN

## 2018-10-17 NOTE — Progress Notes (Signed)
   Subjective:    Patient ID: Daniel Rodriguez, male    DOB: 1971-08-04, 48 y.o.   MRN: [id number]  HPI Here for 3 days of stuffy head, PND, ST, and a dry cough. No fever or body aches. Of note he was treated for strep throat here 2 weeks ago with Amoxicillin, and he seemed to recover well from that. Drinking fluids.    Review of Systems  Constitutional: Negative.   HENT: Positive for congestion, postnasal drip and sore throat. Negative for sinus pressure and sinus pain.   Eyes: Negative.   Respiratory: Positive for cough.   Gastrointestinal: Negative.        Objective:   Physical Exam Constitutional:      Appearance: Normal appearance.  HENT:     Right Ear: Tympanic membrane and ear canal normal.     Left Ear: Tympanic membrane and ear canal normal.     Nose: Nose normal.     Mouth/Throat:     Pharynx: Oropharynx is clear.  Eyes:     Conjunctiva/sclera: Conjunctivae normal.  Pulmonary:     Effort: Pulmonary effort is normal. No respiratory distress.     Breath sounds: Normal breath sounds. No stridor. No wheezing, rhonchi or rales.  Lymphadenopathy:     Cervical: No cervical adenopathy.  Neurological:     Mental Status: He is alert.           Assessment & Plan:  Viral URI. Rest, drink fluids, use Hydromet for cough.  Gershon Crane, MD

## 2018-11-14 ENCOUNTER — Other Ambulatory Visit: Payer: Self-pay | Admitting: Family Medicine

## 2018-11-14 NOTE — Telephone Encounter (Unsigned)
Copied from CRM [id number]. Topic: Quick Communication - Rx Refill/Question >> Nov 14, 2018  9:51 AM Gaynelle Adu wrote: Medication: HYDROcodone-homatropine (HYDROMET) 5-1.5 MG/5ML syrup Has the patient contacted their pharmacy? Yes    Preferred Pharmacy (with phone number or street name): [id number] [place], [place] - [address] AT [facility] OF [address] [phone] (Phone) [phone] (Fax)    Agent: Please be advised that RX refills may take up to 3 business days. We ask that you follow-up with your pharmacy.

## 2018-11-15 MED ORDER — HYDROCODONE-HOMATROPINE 5-1.5 MG/5ML PO SYRP: 5.0000 mL | ORAL_SOLUTION | ORAL | 0 refills | Status: DC | PRN

## 2019-01-22 ENCOUNTER — Telehealth: Payer: Self-pay | Admitting: *Deleted

## 2019-01-22 NOTE — Telephone Encounter (Signed)
Copied from CRM [id number]. Topic: Appointment Scheduling - Scheduling Inquiry for Clinic >> Jan 22, 2019 11:39 AM Marylen Ponto wrote: Reason for CRM: Pt called to see if he should come in for his physical appt scheduled on 01/30/19. Pt requests call back. Cb# [phone]

## 2019-01-22 NOTE — Telephone Encounter (Signed)
Called to r/s physical appointment message left

## 2019-01-30 ENCOUNTER — Encounter: Payer: PRIVATE HEALTH INSURANCE | Admitting: Family Medicine

## 2019-03-19 ENCOUNTER — Ambulatory Visit: Payer: Self-pay | Admitting: Family Medicine

## 2019-03-19 NOTE — Telephone Encounter (Signed)
Pt. Reports he never started taking his Irbesartan 75 mg in Jan. 2020.(Last visit) States he has been very lethargic, no other symptoms. BP this morning 170/110 . Took one pill and BP is 168/108. Spoke with Sheena in the practice and will forward triage over for review.  Answer Assessment - Initial Assessment Questions 1. BLOOD PRESSURE: "What is the blood pressure?" "Did you take at least two measurements 5 minutes apart?"     170/110 2. ONSET: "When did you take your blood pressure?"     This morning 3. HOW: "How did you obtain the blood pressure?" (e.g., visiting nurse, automatic home BP monitor)     Home BP monitor 4. HISTORY: "Do you have a history of high blood pressure?"     Yes 5. MEDICATIONS: "Are you taking any medications for blood pressure?" "Have you missed any doses recently?"     Has not taken anything since Jan. 6. OTHER SYMPTOMS: "Do you have any symptoms?" (e.g., headache, chest pain, blurred vision, difficulty breathing, weakness)     Lethargic, has had some headache 7. PREGNANCY: "Is there any chance you are pregnant?" "When was your last menstrual period?"     n/a  Protocols used: HIGH BLOOD PRESSURE-A-AH

## 2019-03-19 NOTE — Telephone Encounter (Signed)
See my previous note

## 2019-03-19 NOTE — Telephone Encounter (Signed)
Called and spoke with pt and he is aware of Dr. Barbie Banner recs.  He started the medication this morning and is scheduled for a physical in 3 weeks and will follow up at that time.

## 2019-03-19 NOTE — Telephone Encounter (Signed)
Dr Fry please advise. thanks 

## 2019-03-19 NOTE — Telephone Encounter (Signed)
Tell him to start taking the medication EVERY day and set up a Doxy with [place] in 3 weeks to follow up

## 2019-03-26 ENCOUNTER — Ambulatory Visit (INDEPENDENT_AMBULATORY_CARE_PROVIDER_SITE_OTHER): Payer: PRIVATE HEALTH INSURANCE | Admitting: Family Medicine

## 2019-03-26 ENCOUNTER — Other Ambulatory Visit: Payer: Self-pay

## 2019-03-26 ENCOUNTER — Encounter: Payer: Self-pay | Admitting: Family Medicine

## 2019-03-26 DIAGNOSIS — I10 Essential (primary) hypertension: Secondary | ICD-10-CM | POA: Diagnosis not present

## 2019-03-26 MED ORDER — LOSARTAN POTASSIUM-HCTZ 50-12.5 MG PO TABS: 1.0000 | ORAL_TABLET | Freq: Every day | ORAL | 11 refills | Status: DC

## 2019-03-26 NOTE — Progress Notes (Signed)
Subjective:    Patient ID: Daniel Rodriguez, male    DOB: 05-Sep-1971, 48 y.o.   MRN: [id number]  HPI Virtual Visit via Video Note  I connected with the patient on 03/26/19 at 10:30 AM EDT by a video enabled telemedicine application and verified that I am speaking with the correct person using two identifiers.  Location patient: home Location provider:work or home office Persons participating in the virtual visit: patient, provider  I discussed the limitations of evaluation and management by telemedicine and the availability of in person appointments. The patient expressed understanding and agreed to proceed.   HPI: Here to discuss BP. He had a virtual visit last week for elevated BP and we asked him to get back on Irbesartan. He did so and now he feels better. The BP is down to 160/100. He asks if he can go back on Losartan HCT however because he feels more confident on that medication. He did well with this until earlier this year when we switched due to a supply issue.    ROS: See pertinent positives and negatives per HPI.  Past Medical History:  Diagnosis Date  . Anxiety   . GERD (gastroesophageal reflux disease)   . Hyperlipidemia     Past Surgical History:  Procedure Laterality Date  . VASECTOMY      Family History  Problem Relation Age of Onset  . Hypertension Unknown        fhx  . Cancer Unknown        fhx  . Coronary artery disease Unknown        fhx  . Coronary artery disease Father      Current Outpatient Medications:  .  benzonatate (TESSALON) 100 MG capsule, Take 1 capsule (100 mg total) by mouth 2 (two) times daily as needed for cough., Disp: 20 capsule, Rfl: 0 .  HYDROcodone-homatropine (HYDROMET) 5-1.5 MG/5ML syrup, Take 5 mLs by mouth every 4 (four) hours as needed., Disp: 240 mL, Rfl: 0 .  irbesartan (AVAPRO) 75 MG tablet, Take 1 tablet (75 mg total) by mouth daily., Disp: 30 tablet, Rfl: 0 .  losartan-hydrochlorothiazide (HYZAAR) 50-12.5 MG tablet, TAKE  1 TABLET BY MOUTH EVERY DAY, Disp: 30 tablet, Rfl: 5  EXAM:  VITALS per patient if applicable:  GENERAL: alert, oriented, appears well and in no acute distress  HEENT: atraumatic, conjunttiva clear, no obvious abnormalities on inspection of external nose and ears  NECK: normal movements of the head and neck  LUNGS: on inspection no signs of respiratory distress, breathing rate appears normal, no obvious gross SOB, gasping or wheezing  CV: no obvious cyanosis  MS: moves all visible extremities without noticeable abnormality  PSYCH/NEURO: pleasant and cooperative, no obvious depression or anxiety, speech and thought processing grossly intact  ASSESSMENT AND PLAN: HTN. He will stop Irbesartan and start back on Losartan HCT 50-12.5 daily. He will see on for a well exam on 04-11-19.  Alysia Penna, MD  Discussed the following assessment and plan:  No diagnosis found.     I discussed the assessment and treatment plan with the patient. The patient was provided an opportunity to ask questions and all were answered. The patient agreed with the plan and demonstrated an understanding of the instructions.   The patient was advised to call back or seek an in-person evaluation if the symptoms worsen or if the condition fails to improve as anticipated.     Review of Systems     Objective:   Physical  Exam        Assessment & Plan:

## 2019-04-11 ENCOUNTER — Encounter: Payer: Self-pay | Admitting: Family Medicine

## 2019-04-11 ENCOUNTER — Ambulatory Visit (INDEPENDENT_AMBULATORY_CARE_PROVIDER_SITE_OTHER): Payer: PRIVATE HEALTH INSURANCE | Admitting: Family Medicine

## 2019-04-11 ENCOUNTER — Other Ambulatory Visit: Payer: Self-pay

## 2019-04-11 VITALS — BP 130/98 | HR 74 | Temp 98.0°F | Ht 71.0 in | Wt 256.0 lb

## 2019-04-11 DIAGNOSIS — Z209 Contact with and (suspected) exposure to unspecified communicable disease: Secondary | ICD-10-CM | POA: Diagnosis not present

## 2019-04-11 DIAGNOSIS — Z0000 Encounter for general adult medical examination without abnormal findings: Secondary | ICD-10-CM

## 2019-04-11 LAB — LIPID PANEL
Cholesterol: 171 mg/dL (ref 0–200)
HDL: 28.6 mg/dL — ABNORMAL LOW (ref >39.00)
LDL Cholesterol: 111 mg/dL — ABNORMAL HIGH (ref 0–99)
NonHDL: 142.67
Total CHOL/HDL Ratio: 6
Triglycerides: 158 mg/dL — ABNORMAL HIGH (ref 0.0–149.0)
VLDL: 31.6 mg/dL (ref 0.0–40.0)

## 2019-04-11 LAB — POC URINALSYSI DIPSTICK (AUTOMATED)
Bilirubin, UA: NEGATIVE
Blood, UA: NEGATIVE
Glucose, UA: NEGATIVE
Ketones, UA: NEGATIVE
Leukocytes, UA: NEGATIVE
Nitrite, UA: NEGATIVE
Protein, UA: POSITIVE — AB
Spec Grav, UA: >=1.03 — AB (ref 1.010–1.025)
Urobilinogen, UA: 0.2 E.U./dL
pH, UA: 6 (ref 5.0–8.0)

## 2019-04-11 LAB — BASIC METABOLIC PANEL
BUN: 16 mg/dL (ref 6–23)
CO2: 30 mEq/L (ref 19–32)
Calcium: 8.5 mg/dL (ref 8.4–10.5)
Chloride: 103 mEq/L (ref 96–112)
Creatinine, Ser: 0.97 mg/dL (ref 0.40–1.50)
GFR: 81.83 mL/min (ref >60.00)
Glucose, Bld: 83 mg/dL (ref 70–99)
Potassium: 4.4 mEq/L (ref 3.5–5.1)
Sodium: 141 mEq/L (ref 135–145)

## 2019-04-11 LAB — CBC WITH DIFFERENTIAL/PLATELET
Basophils Absolute: 0.1 10*3/uL (ref 0.0–0.1)
Basophils Relative: 0.7 % (ref 0.0–3.0)
Eosinophils Absolute: 0.4 10*3/uL (ref 0.0–0.7)
Eosinophils Relative: 4.7 % (ref 0.0–5.0)
HCT: 45.1 % (ref 39.0–52.0)
Hemoglobin: 15.2 g/dL (ref 13.0–17.0)
Lymphocytes Relative: 29.3 % (ref 12.0–46.0)
Lymphs Abs: 2.3 10*3/uL (ref 0.7–4.0)
MCHC: 33.8 g/dL (ref 30.0–36.0)
MCV: 87.4 fl (ref 78.0–100.0)
Monocytes Absolute: 0.6 10*3/uL (ref 0.1–1.0)
Monocytes Relative: 7.4 % (ref 3.0–12.0)
Neutro Abs: 4.5 10*3/uL (ref 1.4–7.7)
Neutrophils Relative %: 57.9 % (ref 43.0–77.0)
Platelets: 264 10*3/uL (ref 150.0–400.0)
RBC: 5.16 Mil/uL (ref 4.22–5.81)
RDW: 13.8 % (ref 11.5–15.5)
WBC: 7.8 10*3/uL (ref 4.0–10.5)

## 2019-04-11 LAB — HEPATIC FUNCTION PANEL
ALT: 32 U/L (ref 0–53)
AST: 18 U/L (ref 0–37)
Albumin: 4 g/dL (ref 3.5–5.2)
Alkaline Phosphatase: 114 U/L (ref 39–117)
Bilirubin, Direct: 0.1 mg/dL (ref 0.0–0.3)
Total Bilirubin: 0.7 mg/dL (ref 0.2–1.2)
Total Protein: 6.4 g/dL (ref 6.0–8.3)

## 2019-04-11 LAB — TSH: TSH: 2.52 u[IU]/mL (ref 0.35–4.50)

## 2019-04-11 LAB — PSA: PSA: 1.31 ng/mL (ref 0.10–4.00)

## 2019-04-11 NOTE — Progress Notes (Signed)
Subjective:    Patient ID: Daniel Rodriguez, male    DOB: 08/15/1971, 48 y.o.   MRN: [id number]  HPI Here for a well exam. He feels well. We recently changed his BP medication to Losartan HCT, and he feels much better. His BP at home has been running 130-140/80-90. He has changed his diet and has lost a few pounds.    Review of Systems  Constitutional: Negative.   HENT: Negative.   Eyes: Negative.   Respiratory: Negative.   Cardiovascular: Negative.   Gastrointestinal: Negative.   Genitourinary: Negative.   Musculoskeletal: Negative.   Skin: Negative.   Neurological: Negative.   Psychiatric/Behavioral: Negative.        Objective:   Physical Exam Constitutional:      General: He is not in acute distress.    Appearance: He is well-developed. He is not diaphoretic.  HENT:     Head: Normocephalic and atraumatic.     Right Ear: External ear normal.     Left Ear: External ear normal.     Nose: Nose normal.     Mouth/Throat:     Pharynx: No oropharyngeal exudate.  Eyes:     General: No scleral icterus.       Right eye: No discharge.        Left eye: No discharge.     Conjunctiva/sclera: Conjunctivae normal.     Pupils: Pupils are equal, round, and reactive to light.  Neck:     Musculoskeletal: Neck supple.     Thyroid: No thyromegaly.     Vascular: No JVD.     Trachea: No tracheal deviation.  Cardiovascular:     Rate and Rhythm: Normal rate and regular rhythm.     Heart sounds: Normal heart sounds. No murmur. No friction rub. No gallop.   Pulmonary:     Effort: Pulmonary effort is normal. No respiratory distress.     Breath sounds: Normal breath sounds. No wheezing or rales.  Chest:     Chest wall: No tenderness.  Abdominal:     General: Bowel sounds are normal. There is no distension.     Palpations: Abdomen is soft. There is no mass.     Tenderness: There is no abdominal tenderness. There is no guarding or rebound.  Genitourinary:    Penis: Normal. No tenderness.       Scrotum/Testes: Normal.     Prostate: Normal.     Rectum: Normal. Guaiac result negative.  Musculoskeletal: Normal range of motion.        General: No tenderness.  Lymphadenopathy:     Cervical: No cervical adenopathy.  Skin:    General: Skin is warm and dry.     Coloration: Skin is not pale.     Findings: No erythema or rash.  Neurological:     Mental Status: He is alert and oriented to person, place, and time.     Cranial Nerves: No cranial nerve deficit.     Motor: No abnormal muscle tone.     Coordination: Coordination normal.     Deep Tendon Reflexes: Reflexes are normal and symmetric. Reflexes normal.  Psychiatric:        Behavior: Behavior normal.        Thought Content: Thought content normal.        Judgment: Judgment normal.           Assessment & Plan:  Well exam. We discussed diet and exercise. I think when he loses a few more pounds,  his BP will reach our target range. Get fasting labs. Set upa colonoscopy.  Alysia Penna, MD

## 2019-04-12 LAB — SAR COV2 SEROLOGY (COVID19)AB(IGG),IA: SARS CoV2 AB IGG: NEGATIVE

## 2019-05-14 ENCOUNTER — Encounter: Payer: Self-pay | Admitting: Family Medicine

## 2020-01-06 ENCOUNTER — Encounter: Payer: Self-pay | Admitting: Family Medicine

## 2020-04-11 ENCOUNTER — Encounter: Payer: PRIVATE HEALTH INSURANCE | Admitting: Family Medicine

## 2020-04-18 ENCOUNTER — Other Ambulatory Visit: Payer: Self-pay | Admitting: Family Medicine

## 2020-05-02 ENCOUNTER — Ambulatory Visit (INDEPENDENT_AMBULATORY_CARE_PROVIDER_SITE_OTHER): Payer: Commercial Managed Care - PPO | Admitting: Family Medicine

## 2020-05-02 ENCOUNTER — Other Ambulatory Visit: Payer: Self-pay

## 2020-05-02 ENCOUNTER — Encounter: Payer: Self-pay | Admitting: Family Medicine

## 2020-05-02 VITALS — BP 122/84 | HR 78 | Temp 98.1°F | Ht 71.02 in | Wt 295.5 lb

## 2020-05-02 DIAGNOSIS — Z0000 Encounter for general adult medical examination without abnormal findings: Secondary | ICD-10-CM

## 2020-05-02 MED ORDER — LOSARTAN POTASSIUM-HCTZ 50-12.5 MG PO TABS: 1.0000 | ORAL_TABLET | Freq: Every day | ORAL | 3 refills | Status: DC

## 2020-05-02 NOTE — Progress Notes (Signed)
   Subjective:    Patient ID: Daniel Rodriguez, male    DOB: Jul 18, 1971, 49 y.o.   MRN: [id number]  HPI Here for a well exam. He feels fine.   Review of Systems  Constitutional: Negative.   HENT: Negative.   Eyes: Negative.   Respiratory: Negative.   Cardiovascular: Negative.   Gastrointestinal: Negative.   Genitourinary: Negative.   Musculoskeletal: Negative.   Skin: Negative.   Neurological: Negative.   Psychiatric/Behavioral: Negative.        Objective:   Physical Exam Constitutional:      General: He is not in acute distress.    Appearance: He is well-developed. He is obese. He is not diaphoretic.  HENT:     Head: Normocephalic and atraumatic.     Right Ear: External ear normal.     Left Ear: External ear normal.     Nose: Nose normal.     Mouth/Throat:     Pharynx: No oropharyngeal exudate.  Eyes:     General: No scleral icterus.       Right eye: No discharge.        Left eye: No discharge.     Conjunctiva/sclera: Conjunctivae normal.     Pupils: Pupils are equal, round, and reactive to light.  Neck:     Thyroid: No thyromegaly.     Vascular: No JVD.     Trachea: No tracheal deviation.  Cardiovascular:     Rate and Rhythm: Normal rate and regular rhythm.     Heart sounds: Normal heart sounds. No murmur heard.  No friction rub. No gallop.   Pulmonary:     Effort: Pulmonary effort is normal. No respiratory distress.     Breath sounds: Normal breath sounds. No wheezing or rales.  Chest:     Chest wall: No tenderness.  Abdominal:     General: Bowel sounds are normal. There is no distension.     Palpations: Abdomen is soft. There is no mass.     Tenderness: There is no abdominal tenderness. There is no guarding or rebound.  Genitourinary:    Penis: Normal. No tenderness.      Testes: Normal.     Prostate: Normal.     Rectum: Normal. Guaiac result negative.  Musculoskeletal:        General: No tenderness. Normal range of motion.     Cervical back: Neck  supple.  Lymphadenopathy:     Cervical: No cervical adenopathy.  Skin:    General: Skin is warm and dry.     Coloration: Skin is not pale.     Findings: No erythema or rash.  Neurological:     Mental Status: He is alert and oriented to person, place, and time.     Cranial Nerves: No cranial nerve deficit.     Motor: No abnormal muscle tone.     Coordination: Coordination normal.     Deep Tendon Reflexes: Reflexes are normal and symmetric. Reflexes normal.  Psychiatric:        Behavior: Behavior normal.        Thought Content: Thought content normal.        Judgment: Judgment normal.           Assessment & Plan:  Well exam. We discussed diet and exercise. Get fasting labs. Set up his first colonoscopy.  Gershon Crane, MD

## 2020-05-03 LAB — BASIC METABOLIC PANEL
BUN: 15 mg/dL (ref 7–25)
CO2: 24 mmol/L (ref 20–32)
Calcium: 8.7 mg/dL (ref 8.6–10.3)
Chloride: 105 mmol/L (ref 98–110)
Creat: 0.96 mg/dL (ref 0.70–1.33)
Glucose, Bld: 95 mg/dL (ref 65–99)
Potassium: 4.5 mmol/L (ref 3.5–5.3)
Sodium: 141 mmol/L (ref 135–146)

## 2020-05-03 LAB — CBC WITH DIFFERENTIAL/PLATELET
Absolute Monocytes: 540 cells/uL (ref 200–950)
Basophils Absolute: 38 cells/uL (ref 0–200)
Basophils Relative: 0.5 %
Eosinophils Absolute: 289 cells/uL (ref 15–500)
Eosinophils Relative: 3.8 %
HCT: 45.5 % (ref 38.5–50.0)
Hemoglobin: 15.6 g/dL (ref 13.2–17.1)
Lymphs Abs: 2098 cells/uL (ref 850–3900)
MCH: 29.5 pg (ref 27.0–33.0)
MCHC: 34.3 g/dL (ref 32.0–36.0)
MCV: 86.2 fL (ref 80.0–100.0)
MPV: 11.3 fL (ref 7.5–12.5)
Monocytes Relative: 7.1 %
Neutro Abs: 4636 cells/uL (ref 1500–7800)
Neutrophils Relative %: 61 %
Platelets: 229 10*3/uL (ref 140–400)
RBC: 5.28 10*6/uL (ref 4.20–5.80)
RDW: 13.2 % (ref 11.0–15.0)
Total Lymphocyte: 27.6 %
WBC: 7.6 10*3/uL (ref 3.8–10.8)

## 2020-05-03 LAB — HEPATIC FUNCTION PANEL
AG Ratio: 1.7 (calc) (ref 1.0–2.5)
ALT: 29 U/L (ref 9–46)
AST: 19 U/L (ref 10–35)
Albumin: 4 g/dL (ref 3.6–5.1)
Alkaline phosphatase (APISO): 112 U/L (ref 35–144)
Bilirubin, Direct: 0.1 mg/dL (ref 0.0–0.2)
Globulin: 2.4 g/dL (calc) (ref 1.9–3.7)
Indirect Bilirubin: 0.5 mg/dL (calc) (ref 0.2–1.2)
Total Bilirubin: 0.6 mg/dL (ref 0.2–1.2)
Total Protein: 6.4 g/dL (ref 6.1–8.1)

## 2020-05-03 LAB — TSH: TSH: 2.87 mIU/L (ref 0.40–4.50)

## 2020-05-03 LAB — LIPID PANEL
Cholesterol: 184 mg/dL (ref <200)
HDL: 30 mg/dL — ABNORMAL LOW (ref >=40)
LDL Cholesterol (Calc): 124 mg/dL (calc) — ABNORMAL HIGH
Non-HDL Cholesterol (Calc): 154 mg/dL (calc) — ABNORMAL HIGH (ref <130)
Total CHOL/HDL Ratio: 6.1 (calc) — ABNORMAL HIGH (ref <5.0)
Triglycerides: 178 mg/dL — ABNORMAL HIGH (ref <150)

## 2020-05-03 LAB — PSA: PSA: 1.4 ng/mL (ref <=4.0)

## 2020-06-24 ENCOUNTER — Telehealth: Payer: Self-pay | Admitting: Family Medicine

## 2020-06-24 ENCOUNTER — Encounter: Payer: Self-pay | Admitting: Family Medicine

## 2020-06-24 NOTE — Telephone Encounter (Signed)
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  Disposition: Dr's Folder

## 2020-06-24 NOTE — Telephone Encounter (Signed)
Placed in red folder for completion.  

## 2020-06-25 NOTE — Telephone Encounter (Signed)
The form is ready  

## 2020-06-25 NOTE — Telephone Encounter (Signed)
Patient called and advised form is faxed and will be at the front desk for pickup. He says he will pickup today.

## 2020-06-27 NOTE — Telephone Encounter (Signed)
The form is ready  

## 2020-08-25 ENCOUNTER — Telehealth: Payer: Commercial Managed Care - PPO | Admitting: Family Medicine

## 2020-08-25 ENCOUNTER — Encounter: Payer: Self-pay | Admitting: Family Medicine

## 2020-08-25 DIAGNOSIS — R059 Cough, unspecified: Secondary | ICD-10-CM

## 2020-08-25 NOTE — Progress Notes (Signed)
   Subjective:    Patient ID: Daniel Rodriguez, male    DOB: May 01, 1971, 49 y.o.   MRN: [id number]  HPI Virtual Visit via Video Note  I connected with the patient on 08/25/20 at  8:45 AM EST by a video enabled telemedicine application and verified that I am speaking with the correct person using two identifiers.  Location patient: home Location provider:work or home office Persons participating in the virtual visit: patient, provider  I discussed the limitations of evaluation and management by telemedicine and the availability of in person appointments. The patient expressed understanding and agreed to proceed.   HPI: Here for 6 weeks of a dry cough that is very annoying. No sinus congestion or fever. He has some PND but no ST. No SOB or body aches. He had an e visit and they prescribes him a steroid pack, but this did not help. He has tried Claritin, Flonase, Sudafed, and Nyquil with no relief.    ROS: See pertinent positives and negatives per HPI.  Past Medical History:  Diagnosis Date  . Anxiety   . GERD (gastroesophageal reflux disease)   . Hyperlipidemia     Past Surgical History:  Procedure Laterality Date  . VASECTOMY      Family History  Problem Relation Age of Onset  . Hypertension Other        fhx  . Cancer Other        fhx  . Coronary artery disease Other        fhx  . Coronary artery disease Father      Current Outpatient Medications:  .  losartan (COZAAR) 50 MG tablet, Take 50 mg by mouth daily., Disp: , Rfl:  .  clindamycin (CLEOCIN T) 1 % external solution, Apply topically. (Patient not taking: Reported on 05/02/2020), Disp: , Rfl:  .  losartan-hydrochlorothiazide (HYZAAR) 50-12.5 MG tablet, Take 1 tablet by mouth daily., Disp: 90 tablet, Rfl: 3  EXAM:  VITALS per patient if applicable:  GENERAL: alert, oriented, appears well and in no acute distress  HEENT: atraumatic, conjunttiva clear, no obvious abnormalities on inspection of external nose and  ears  NECK: normal movements of the head and neck  LUNGS: on inspection no signs of respiratory distress, breathing rate appears normal, no obvious gross SOB, gasping or wheezing  CV: no obvious cyanosis  MS: moves all visible extremities without noticeable abnormality  PSYCH/NEURO: pleasant and cooperative, no obvious depression or anxiety, speech and thought processing grossly intact  ASSESSMENT AND PLAN: Chronic cough, possibly the result of GERD. He will try taking Prilosec OTC every evening for 2 weeks and then report back to [place].  Gershon Crane, MD  Discussed the following assessment and plan:  No diagnosis found.     I discussed the assessment and treatment plan with the patient. The patient was provided an opportunity to ask questions and all were answered. The patient agreed with the plan and demonstrated an understanding of the instructions.   The patient was advised to call back or seek an in-person evaluation if the symptoms worsen or if the condition fails to improve as anticipated.     Review of Systems     Objective:   Physical Exam        Assessment & Plan:

## 2020-10-27 ENCOUNTER — Encounter: Payer: Self-pay | Admitting: Family Medicine

## 2020-10-27 ENCOUNTER — Telehealth (INDEPENDENT_AMBULATORY_CARE_PROVIDER_SITE_OTHER): Payer: Commercial Managed Care - PPO | Admitting: Family Medicine

## 2020-10-27 DIAGNOSIS — R053 Chronic cough: Secondary | ICD-10-CM | POA: Diagnosis not present

## 2020-10-27 DIAGNOSIS — J40 Bronchitis, not specified as acute or chronic: Secondary | ICD-10-CM | POA: Diagnosis not present

## 2020-10-27 MED ORDER — AZITHROMYCIN 250 MG PO TABS: ORAL_TABLET | ORAL | 0 refills | Status: DC

## 2020-10-27 MED ORDER — HYDROCODONE-HOMATROPINE 5-1.5 MG/5ML PO SYRP
5.0000 mL | ORAL_SOLUTION | ORAL | 0 refills | Status: DC | PRN
Start: 2020-10-27 — End: 2021-01-22

## 2020-10-27 NOTE — Progress Notes (Signed)
Subjective:    Patient ID: Daniel Rodriguez, male    DOB: [birth year], 50 y.o.   MRN: [id number]  HPI Here for 2 issues. First he has had a chronic dry cough for about 6 months. He has tried antihistamines, cough syrups, acid blockers, and other agents with no improvement. Also one week ago he developed muscle aches, nausea without vomiting, PND, headache, and chest congestion. No fever. He is drinking fluids.  Virtual Visit via Video Note  I connected with the patient on 10/27/20 at  1:15 PM EST by a video enabled telemedicine application and verified that I am speaking with the correct person using two identifiers.  Location patient: home Location provider:work or home office Persons participating in the virtual visit: patient, provider  I discussed the limitations of evaluation and management by telemedicine and the availability of in person appointments. The patient expressed understanding and agreed to proceed.   HPI:    ROS: See pertinent positives and negatives per HPI.  Past Medical History:  Diagnosis Date  . Anxiety   . GERD (gastroesophageal reflux disease)   . Hyperlipidemia     Past Surgical History:  Procedure Laterality Date  . VASECTOMY      Family History  Problem Relation Age of Onset  . Hypertension Other        fhx  . Cancer Other        fhx  . Coronary artery disease Other        fhx  . Coronary artery disease Father      Current Outpatient Medications:  .  azithromycin (ZITHROMAX Z-PAK) 250 MG tablet, As directed, Disp: 6 each, Rfl: 0 .  HYDROcodone-homatropine (HYCODAN) 5-1.5 MG/5ML syrup, Take 5 mLs by mouth every 4 (four) hours as needed for cough., Disp: 240 mL, Rfl: 0 .  losartan (COZAAR) 50 MG tablet, Take 50 mg by mouth daily., Disp: , Rfl:   EXAM:  VITALS per patient if applicable:  GENERAL: alert, oriented, appears well and in no acute distress  HEENT: atraumatic, conjunttiva clear, no obvious abnormalities on inspection of external  nose and ears  NECK: normal movements of the head and neck  LUNGS: on inspection no signs of respiratory distress, breathing rate appears normal, no obvious gross SOB, gasping or wheezing  CV: no obvious cyanosis  MS: moves all visible extremities without noticeable abnormality  PSYCH/NEURO: pleasant and cooperative, no obvious depression or anxiety, speech and thought processing grossly intact  ASSESSMENT AND PLAN: The acute symptoms are consistent with a bronchitis, possibly on top of a Covid infection. I advised him to get tested for the Covid virus. We will treat this with a Zpack. For the chronic cough, we will refer him to Pulmonary. Gershon Crane, MD  Discussed the following assessment and plan:  Chronic cough - Plan: Ambulatory referral to Pulmonology     I discussed the assessment and treatment plan with the patient. The patient was provided an opportunity to ask questions and all were answered. The patient agreed with the plan and demonstrated an understanding of the instructions.   The patient was advised to call back or seek an in-person evaluation if the symptoms worsen or if the condition fails to improve as anticipated.    Review of Systems  Constitutional: Negative.   HENT: Positive for congestion and postnasal drip.   Eyes: Negative.   Respiratory: Positive for cough.   Cardiovascular: Negative.   Gastrointestinal: Negative.        Objective:  Physical Exam        Assessment & Plan:

## 2020-12-08 ENCOUNTER — Encounter: Payer: Self-pay | Admitting: Family Medicine

## 2020-12-08 DIAGNOSIS — R053 Chronic cough: Secondary | ICD-10-CM

## 2020-12-09 NOTE — Telephone Encounter (Signed)
I did the referral 

## 2021-01-05 ENCOUNTER — Ambulatory Visit: Payer: Commercial Managed Care - PPO | Admitting: Internal Medicine

## 2021-01-05 ENCOUNTER — Encounter: Payer: Self-pay | Admitting: Internal Medicine

## 2021-01-05 ENCOUNTER — Other Ambulatory Visit: Payer: Self-pay

## 2021-01-05 VITALS — BP 140/100 | HR 82 | Ht 71.0 in | Wt 313.6 lb

## 2021-01-05 DIAGNOSIS — J328 Other chronic sinusitis: Secondary | ICD-10-CM

## 2021-01-05 DIAGNOSIS — R059 Cough, unspecified: Secondary | ICD-10-CM

## 2021-01-05 DIAGNOSIS — Z9189 Other specified personal risk factors, not elsewhere classified: Secondary | ICD-10-CM | POA: Diagnosis not present

## 2021-01-05 DIAGNOSIS — R053 Chronic cough: Secondary | ICD-10-CM | POA: Diagnosis not present

## 2021-01-05 DIAGNOSIS — R498 Other voice and resonance disorders: Secondary | ICD-10-CM

## 2021-01-05 DIAGNOSIS — J387 Other diseases of larynx: Secondary | ICD-10-CM | POA: Diagnosis not present

## 2021-01-05 LAB — CBC WITH DIFFERENTIAL/PLATELET
Basophils Absolute: 0 10*3/uL (ref 0.0–0.1)
Basophils Relative: 0.5 % (ref 0.0–3.0)
Eosinophils Absolute: 0.4 10*3/uL (ref 0.0–0.7)
Eosinophils Relative: 4.7 % (ref 0.0–5.0)
HCT: 47.8 % (ref 39.0–52.0)
Hemoglobin: 16.2 g/dL (ref 13.0–17.0)
Lymphocytes Relative: 25.4 % (ref 12.0–46.0)
Lymphs Abs: 2 10*3/uL (ref 0.7–4.0)
MCHC: 33.9 g/dL (ref 30.0–36.0)
MCV: 87.3 fl (ref 78.0–100.0)
Monocytes Absolute: 0.5 10*3/uL (ref 0.1–1.0)
Monocytes Relative: 5.7 % (ref 3.0–12.0)
Neutro Abs: 5.1 10*3/uL (ref 1.4–7.7)
Neutrophils Relative %: 63.7 % (ref 43.0–77.0)
Platelets: 224 10*3/uL (ref 150.0–400.0)
RBC: 5.47 Mil/uL (ref 4.22–5.81)
RDW: 14 % (ref 11.5–15.5)
WBC: 8 10*3/uL (ref 4.0–10.5)

## 2021-01-05 NOTE — Patient Instructions (Signed)
ICD-10-CM   1. Chronic cough  R05.3   2. Other chronic sinusitis  J32.8   3. Irritable larynx  J38.7   4. 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 Marchelle Gearing or nurse practitioner in the next few to several weeks but after completing all of the above  -Based on the results we will discuss treatment/management options for chronic refractory cough including gabapentin, voice rehabilitation and voice rest

## 2021-01-05 NOTE — Progress Notes (Signed)
OV 01/05/2021  Subjective:  Patient ID: Daniel Rodriguez, male , DOB: 26-Oct-1970 , age 50 y.o. , MRN: [id number] , ADDRESS: [address] [place] [address] PCP Nelwyn Salisbury, MD Patient Care Team: Nelwyn Salisbury, MD as PCP - General  This Provider for this visit: Treatment Team:  Attending Provider: Kalman Shan, MD    01/05/2021 -   Chief Complaint  Patient presents with  . Consult    Pt is being referred by PCP due to having a chronic cough.  Pt states he has had complaints of cough for 5 months. States he does cough throughout the day and also states when he lays down at night, he will cough for about 10 minutes straight. Pt also coughs after eating.     HPI Daniel Rodriguez 50 y.o. -referred for chronic cough.  History is provided by the patient and review of the medical records.  He tells me approximately 2 years ago right before the onset of Covid he developed a respiratory viral infection after coming back from [place].  Family member is also sick.  There is no Covid testing available at the time.  He said he suffers from chronic cough for 3 months.  At times he would have near syncope but then this resolved.  Then again in Christmas 2021 the cough started this time no clear-cut preceding viral infection.  Since then it is stable and at current severity level RSI score is below.  He has got obesity and snores but denies any excessive daytime somnolence.  A sleep apnea study has been proposed but is not done this.  He has hypertension.  He was on losartan but he stopped this because of the cough but this intervention did not help the cough.  He says several years ago used to get up in the middle of the night with choking spells consistent with acid reflux but is not had this recently and therefore he does not believe he has active acid reflux.  He does drink a lot of soda [he works for [employer]].  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  Please conference calls to make his cough worse.  No definite history of asthma.  Although he does admit to having fall allergies.  Cough is made worse by talking for a long time.  Also made worse as soon as he lies down to sleep or after eating sometimes while leaning forward but also randomly.  There is no change in the cough after stopping losartan or trying PPI or allergy medicines or cough medications.  The relief is only been very temporary. He does feel there is phlegm in his throat.  He feels this postnasal drainage.  But is not able to bring out anything.  Here in the office he has a barking quality to the cough.   Dr Gretta Cool Reflux Symptom Index (> 13-15 suggestive of LPR cough) 0 -> 5  =  none ->severe problem 01/05/2021   Hoarseness of problem with voice 2  Clearing  Of Throat 3  Excess throat mucus or feeling of post nasal drip 5  Difficulty swallowing food, liquid or tablets 0  Cough after eating or lying down 5  Breathing difficulties or choking episodes 2  Troublesome or annoying cough 5  Sensation of something sticking in throat or lump in throat 2  Heartburn, chest pain, indigestion, or stomach acid  coming up 1  TOTAL 25        Results for Rodriguez, Daniel FALOTICO" (MRN [id number]) as of 01/05/2021 09:39  Ref. Range 03/01/2014 08:28 01/21/2017 11:31 01/24/2018 08:57 04/11/2019 09:02 05/02/2020 08:40  Eosinophils Absolute Latest Ref Range: 15 - 500 cells/uL 0.3 0.3 0.3 0.4 289     PFT  No flowsheet data found.     has a past medical history of Anxiety, GERD (gastroesophageal reflux disease), and Hyperlipidemia.   reports that he has never smoked. He has never used smokeless tobacco.  Past Surgical History:  Procedure Laterality Date  . VASECTOMY      Allergies  Allergen Reactions  . Lisinopril Cough    Immunization History  Administered Date(s) Administered  . PFIZER(Purple Top)SARS-COV-2 Vaccination 12/13/2019, 01/03/2020  . Tdap 05/24/2015     Family History  Problem Relation Age of Onset  . Hypertension Other        fhx  . Cancer Other        fhx  . Coronary artery disease Other        fhx  . Coronary artery disease Father      Current Outpatient Medications:  .  HYDROcodone-homatropine (HYCODAN) 5-1.5 MG/5ML syrup, Take 5 mLs by mouth every 4 (four) hours as needed for cough., Disp: 240 mL, Rfl: 0 .  losartan (COZAAR) 50 MG tablet, Take 50 mg by mouth daily. (Patient not taking: Reported on 01/05/2021), Disp: , Rfl:       Objective:   Vitals:   01/05/21 0926  BP: (!) 140/100  Pulse: 82  SpO2: 99%  Weight: (!) 313 lb 9.6 oz (142.2 kg)  Height: 5\' 11"  (1.803 m)    Estimated body mass index is 43.74 kg/m as calculated from the following:   Height as of this encounter: 5\' 11"  (1.803 m).   Weight as of this encounter: 313 lb 9.6 oz (142.2 kg).  @WEIGHTCHANGE @    01/05/21 0926  Weight: (!) 313 lb 9.6 oz (142.2 kg)     Physical Exam  General Appearance:    Alert, cooperative, no distress, appears stated age - yes , Deconditioned looking - no , OBESE  - yes, Sitting on Wheelchair -  No. BARKING QUALITY TO COUGH  Head:    Normocephalic, without obvious abnormality, atraumatic  Eyes:    PERRL, conjunctiva/corneas clear,  Ears:    Normal TM's and external ear canals, both ears  Nose:   Nares normal, septum midline, mucosa normal, no drainage    or sinus tenderness. OXYGEN ON  - no . Patient is @ ra   Throat:   Lips, mucosa, and tno ue normal; teeth and gums normal. Cyanosis on lips - no  Neck:   Supple, symmetrical, trachea midline, no adenopathy;    thyroid:  no enlargement/tenderness/nodules; no carotid   bruit or JVD  Back:     Symmetric, no curvature, ROM normal, no CVA tenderness  Lungs:     Distress - no , Wheeze no, Barrell Chest - no, Purse lip breathing - no, Crackles - no   Chest Wall:    No tenderness or deformity.    Heart:    Regular rate and rhythm, S1 and S2 normal, no rub    or gallop, Murmur - no  Breast Exam:    NOT DONE  Abdomen:     Soft, non-tender, bowel sounds active all four quadrants,    no masses, no organomegaly. Visceral obesity - yes  Genitalia:  NOT DONE  Rectal:   NOT DONE  Extremities:   Extremities - normal, Has Cane - no, Clubbing - no, Edema - no  Pulses:   2+ and symmetric all extremities  Skin:   Stigmata of Connective Tissue Disease - no  Lymph nodes:   Cervical, supraclavicular, and axillary nodes normal  Psychiatric:  Neurologic:   Pleasant - yes, Anxious - no, Flat affect - no  CAm-ICU - neg, Alert and Oriented x 3 - yes, Moves all 4s - yes, Speech - normal, Cognition - intact         Assessment:       ICD-10-CM   1. Chronic cough  R05.3   2. Other chronic sinusitis  J32.8   3. Irritable larynx  J38.7   4. Excessive consumption of soda pop  Z91.89   5. Voice strain  R49.8   6. Cough  R05.9        Plan:     Patient Instructions     ICD-10-CM   1. Chronic cough  R05.3   2. Other chronic sinusitis  J32.8   3. Irritable larynx  J38.7   4. 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 Marchelle Gearing or nurse practitioner in the next  few to several weeks but after completing all of the above  -Based on the results we will discuss treatment/management options for chronic refractory cough including gabapentin, voice rehabilitation and voice rest     SIGNATURE    Dr. Kalman Shan, M.D., F.C.C.P,  Pulmonary and Critical Care Medicine Staff Physician, [facility] Center Director - Interstitial Lung Disease  Program  Pulmonary Fibrosis [facility] at [facility] [place], [place], [address]  Pager: [phone], If no answer or between  15:00h - 7:00h: call [phone] Telephone: [phone]  10:00 AM 01/05/2021

## 2021-01-06 LAB — IGE: IgE (Immunoglobulin E), Serum: 18 kU/L (ref <=114)

## 2021-01-06 NOTE — Progress Notes (Signed)
Cbc normal except blood eos slightly up raising possibility of asthma. Will await other test results.

## 2021-01-20 ENCOUNTER — Other Ambulatory Visit: Payer: Commercial Managed Care - PPO

## 2021-01-22 ENCOUNTER — Ambulatory Visit (INDEPENDENT_AMBULATORY_CARE_PROVIDER_SITE_OTHER)
Admission: RE | Admit: 2021-01-22 | Discharge: 2021-01-22 | Disposition: A | Discharge status: Home / Self Care | Payer: Commercial Managed Care - PPO | Source: Ambulatory Visit | Attending: Internal Medicine | Admitting: Internal Medicine

## 2021-01-22 ENCOUNTER — Other Ambulatory Visit: Payer: Self-pay

## 2021-01-22 ENCOUNTER — Encounter: Payer: Self-pay | Admitting: Family Medicine

## 2021-01-22 DIAGNOSIS — R053 Chronic cough: Secondary | ICD-10-CM

## 2021-01-22 DIAGNOSIS — J328 Other chronic sinusitis: Secondary | ICD-10-CM | POA: Diagnosis not present

## 2021-01-22 DIAGNOSIS — R059 Cough, unspecified: Secondary | ICD-10-CM

## 2021-01-22 MED ORDER — HYDROCODONE BIT-HOMATROP MBR 5-1.5 MG/5ML PO SOLN: 5.0000 mL | ORAL | 0 refills | Status: DC | PRN

## 2021-01-22 NOTE — Telephone Encounter (Signed)
Done

## 2021-01-29 ENCOUNTER — Telehealth: Payer: Self-pay | Admitting: Internal Medicine

## 2021-01-29 DIAGNOSIS — J328 Other chronic sinusitis: Secondary | ICD-10-CM

## 2021-01-29 DIAGNOSIS — R053 Chronic cough: Secondary | ICD-10-CM

## 2021-01-29 NOTE — Telephone Encounter (Signed)
Ct sinus with some air fluid level v thicking - refer ENT  CT chest - ok IgE - normal Blood eos slightly  High  Plan  refer ENT  go through resot of workup and reutn to office per plan

## 2021-02-02 NOTE — Telephone Encounter (Signed)
Attempted to call pt but unable to reach. Left message for pt to return call. 

## 2021-02-03 NOTE — Telephone Encounter (Signed)
Called and spoke with pt letting him know the results of ct scans and labwork and stated to pt that we were going to refer him to  ENT. Pt verbalized understanding. Referral has been placed. Nothing further needed.

## 2021-02-03 NOTE — Telephone Encounter (Signed)
Pt returning a phone call regarding his ct results. Pt can be reached at [phone].

## 2021-02-06 ENCOUNTER — Other Ambulatory Visit (HOSPITAL_COMMUNITY): Payer: Commercial Managed Care - PPO

## 2021-02-10 ENCOUNTER — Ambulatory Visit: Payer: Commercial Managed Care - PPO | Admitting: Internal Medicine

## 2021-02-16 ENCOUNTER — Other Ambulatory Visit (HOSPITAL_COMMUNITY)
Admission: RE | Admit: 2021-02-16 | Discharge: 2021-02-16 | Disposition: A | Discharge status: Home / Self Care | Payer: Commercial Managed Care - PPO | Source: Ambulatory Visit | Attending: Internal Medicine | Admitting: Internal Medicine

## 2021-02-16 DIAGNOSIS — Z01812 Encounter for preprocedural laboratory examination: Secondary | ICD-10-CM | POA: Diagnosis present

## 2021-02-16 DIAGNOSIS — Z20822 Contact with and (suspected) exposure to covid-19: Secondary | ICD-10-CM | POA: Insufficient documentation

## 2021-02-16 LAB — SARS CORONAVIRUS 2 (TAT 6-24 HRS): SARS Coronavirus 2: NEGATIVE

## 2021-02-20 ENCOUNTER — Ambulatory Visit: Payer: Commercial Managed Care - PPO | Admitting: Internal Medicine

## 2021-02-20 ENCOUNTER — Ambulatory Visit (INDEPENDENT_AMBULATORY_CARE_PROVIDER_SITE_OTHER): Payer: Commercial Managed Care - PPO | Admitting: Internal Medicine

## 2021-02-20 ENCOUNTER — Other Ambulatory Visit: Payer: Self-pay

## 2021-02-20 ENCOUNTER — Encounter: Payer: Self-pay | Admitting: Internal Medicine

## 2021-02-20 VITALS — BP 132/80 | HR 89 | Temp 98.2°F | Ht 71.0 in | Wt 314.6 lb

## 2021-02-20 DIAGNOSIS — Z9189 Other specified personal risk factors, not elsewhere classified: Secondary | ICD-10-CM | POA: Diagnosis not present

## 2021-02-20 DIAGNOSIS — J387 Other diseases of larynx: Secondary | ICD-10-CM

## 2021-02-20 DIAGNOSIS — R053 Chronic cough: Secondary | ICD-10-CM

## 2021-02-20 DIAGNOSIS — J328 Other chronic sinusitis: Secondary | ICD-10-CM

## 2021-02-20 LAB — PULMONARY FUNCTION TEST
DL/VA % pred: 118 %
DL/VA: 5.2 ml/min/mmHg/L
DLCO cor % pred: 119 %
DLCO cor: 35.79 ml/min/mmHg
DLCO unc % pred: 124 %
DLCO unc: 37.31 ml/min/mmHg
FEF 25-75 Post: 2.98 L/sec
FEF 25-75 Pre: 2.34 L/sec
FEF2575-%Change-Post: 27 %
FEF2575-%Pred-Post: 85 %
FEF2575-%Pred-Pre: 67 %
FEV1-%Change-Post: 8 %
FEV1-%Pred-Post: 85 %
FEV1-%Pred-Pre: 78 %
FEV1-Post: 3.4 L
FEV1-Pre: 3.15 L
FEV1FVC-%Change-Post: 0 %
FEV1FVC-%Pred-Pre: 94 %
FEV6-%Change-Post: 7 %
FEV6-%Pred-Post: 92 %
FEV6-%Pred-Pre: 86 %
FEV6-Post: 4.63 L
FEV6-Pre: 4.32 L
FEV6FVC-%Change-Post: 0 %
FEV6FVC-%Pred-Post: 103 %
FEV6FVC-%Pred-Pre: 103 %
FVC-%Change-Post: 8 %
FVC-%Pred-Post: 90 %
FVC-%Pred-Pre: 83 %
FVC-Post: 4.68 L
FVC-Pre: 4.33 L
Post FEV1/FVC ratio: 73 %
Post FEV6/FVC ratio: 100 %
Pre FEV1/FVC ratio: 73 %
Pre FEV6/FVC Ratio: 100 %
RV % pred: 123 %
RV: 2.63 L
TLC % pred: 94 %
TLC: 6.8 L

## 2021-02-20 LAB — NITRIC OXIDE: Nitric Oxide: 22

## 2021-02-20 NOTE — Patient Instructions (Addendum)
ICD-10-CM   1. Chronic cough  R05.3   2. Other chronic sinusitis  J32.8   3. Irritable larynx  J38.7   4. Excessive consumption of soda pop  Z91.89    You have chronic cough that started as a viral infection and now degenerated into chronic refractory cough [CRC] or cough neuropathy. Glad you are better but slow resolution is due to sinus issues and cough neuropathy. The evidence for asthma is low. There could be acid reflux due  To pepsi consumption  No evidence of lung cancer or pulmonary fibrosis or pneumonia  Plan -Definitely follow nonpharmaceutical recommendations to control cough neuropathy   - Take complete voice rest without any talking or whispering for 2 or 3 days  -Anytime he have the urge to cough or clear your throat drink water or suck on sugarless lozenge -Pharmaceutical intervention: We discussed gabapentin but we took a shared decision making to hold off -Control sinus: Glad he is seeing ENT next week -Control potential acid reflux: Completely quit drinking soda pop -Potential asthma: Probably if asthma is very low.  Hold off on any inhaler therapy  Follow-up - Return in 3-4 months; RSI cough score at follow-up  -Return sooner if needed

## 2021-02-20 NOTE — Progress Notes (Signed)
PFT done today. 

## 2021-02-20 NOTE — Progress Notes (Signed)
OV 01/05/2021  Subjective:  Patient ID: Daniel Rodriguez, male , DOB: 1971-07-05 , age 50 y.o. , MRN: [id number] , ADDRESS: [address] [place] [address] PCP Nelwyn Salisbury, MD Patient Care Team: Nelwyn Salisbury, MD as PCP - General  This Provider for this visit: Treatment Team:  Attending Provider: Kalman Shan, MD    01/05/2021 -   Chief Complaint  Patient presents with  . Consult    Pt is being referred by PCP due to having a chronic cough.  Pt states he has had complaints of cough for 5 months. States he does cough throughout the day and also states when he lays down at night, he will cough for about 10 minutes straight. Pt also coughs after eating.     HPI Daniel Rodriguez 50 y.o. -referred for chronic cough.  History is provided by the patient and review of the medical records.  He tells me approximately 2 years ago right before the onset of Covid he developed a respiratory viral infection after coming back from [place].  Family member is also sick.  There is no Covid testing available at the time.  He said he suffers from chronic cough for 3 months.  At times he would have near syncope but then this resolved.  Then again in Christmas 2021 the cough started this time no clear-cut preceding viral infection.  Since then it is stable and at current severity level RSI score is below.  He has got obesity and snores but denies any excessive daytime somnolence.  A sleep apnea study has been proposed but is not done this.  He has hypertension.  He was on losartan but he stopped this because of the cough but this intervention did not help the cough.  He says several years ago used to get up in the middle of the night with choking spells consistent with acid reflux but is not had this recently and therefore he does not believe he has active acid reflux.  He does drink a lot of soda [he works for [employer]].  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  Please conference calls to make his cough worse.  No definite history of asthma.  Although he does admit to having fall allergies.  Cough is made worse by talking for a long time.  Also made worse as soon as he lies down to sleep or after eating sometimes while leaning forward but also randomly.  There is no change in the cough after stopping losartan or trying PPI or allergy medicines or cough medications.  The relief is only been very temporary. He does feel there is phlegm in his throat.  He feels this postnasal drainage.  But is not able to bring out anything.  Here in the office he has a barking quality to the cough.   Results for HUIE, GHUMAN" (MRN [id number]) as of 01/05/2021 09:39  Ref. Range 03/01/2014 08:28 01/21/2017 11:31 01/24/2018 08:57 04/11/2019 09:02 05/02/2020 08:40  Eosinophils Absolute Latest Ref Range: 15 - 500 cells/uL 0.3 0.3 0.3 0.4 289     PFT  No flowsheet data found.  OV 02/20/2021  Subjective:  Patient ID: Daniel Rodriguez, male , DOB: [birth year] , age 13 y.o. , MRN: [id number] , ADDRESS: [address] [place] [address] PCP Nelwyn Salisbury, MD Patient Care Team: Nelwyn Salisbury, MD as PCP -  General  This Provider for this visit: Treatment Team:  Attending Provider: Kalman Shanamaswamy, Keilani Terrance, MD    02/20/2021 -   Chief Complaint  Patient presents with  . Follow-up    F/U after PFT, feno and CT scans back in April 2022. States his cough is a lot better since last visit.      HPI Daniel Rodriguez 50 y.o. -returns for follow-up to discuss progress with his chronic cough.  His cough is much better.  RSI cough score is improved to 17.  He says this happened spontaneously.  He had high-resolution CT chest no ILD.  No cancer no pneumonia.  He had CT scan as there is some fluid level.  He is going to see ENT next week.  He does not know who.  He continues to clear his throat.  He had blood IgE this is normal.  Blood eosinophil slightly high.  Pulmonary function test  shows slight air trapping and slight bronchodilator response but not statistically significant to meet ATS criteria for asthma.  He feels albuterol inhaler causes paradoxical increase in cough.  At this point in time because of improvement he just wants more conservative follow-up.  We discussed gabapentin for cough neuropathy.  After hearing the side effects he is reluctant.  He does not want to do maintenance inhalers.  He continues to drink soda pop    Dr Gretta CoolKouffman Reflux Symptom Index (> 13-15 suggestive of LPR cough)  01/05/2021  02/20/2021   Hoarseness of problem with voice 2 1  Clearing  Of Throat 3 3  Excess throat mucus or feeling of post nasal drip 5 3  Difficulty swallowing food, liquid or tablets 0 1  Cough after eating or lying down 5 3  Breathing difficulties or choking episodes 2 1  Troublesome or annoying cough 5 3  Sensation of something sticking in throat or lump in throat 2 1  Heartburn, chest pain, indigestion, or stomach acid coming up 1 1  TOTAL 25 17     CT Chest data   IMPRESSION: 1. No evidence of interstitial lung disease. 2. No acute findings are noted in the thorax. 3. Hepatic steatosis.   Electronically Signed   By: Trudie Reedaniel  Entrikin M.D.   On: 01/23/2021 10:32  CT SINUS [place] 2022  IMPRESSION: Moderate fluid level versus mucosal thickening within the left maxillary sinus.  Trace mucosal thickening within the left frontal sinus.  The paranasal sinuses are otherwise normally aerated.  Leftward deviation of the bony nasal septum.  Bilateral middle turbinate concha bullosa (more prominent on the right).   Electronically Signed   By: Jackey LogeKyle  Golden DO   On: 01/23/2021 08:10  PFT  PFT Results Latest Ref Rng & Units 02/20/2021  FVC-Pre L 4.33  FVC-Predicted Pre % 83  FVC-Post L 4.68  FVC-Predicted Post % 90  Pre FEV1/FVC % % 73  Post FEV1/FCV % % 73  FEV1-Pre L 3.15  FEV1-Predicted Pre % 78  FEV1-Post L 3.40  DLCO uncorrected  ml/min/mmHg 37.31  DLCO UNC% % 124  DLCO corrected ml/min/mmHg 35.79  DLCO COR %Predicted % 119  DLVA Predicted % 118  TLC L 6.80  TLC % Predicted % 94  RV % Predicted % 123   feno - 22 ppbb 02/20/2021  Lab Results  Component Value Date   NITRICOXIDE 22 02/20/2021    Results for Kochel, Beverly GustHARRY J "JAY" (MRN [id number]) as of 02/20/2021 10:17  Ref. Range 01/05/2021 10:05 01/22/2021 16:28 01/22/2021 16:30 02/16/2021 14:18  02/20/2021 08:54  Eosinophils Absolute Latest Ref Range: 0.0 - 0.7 K/uL 0.4      Results for RAMAJ, FRANGOS" (MRN [id number]) as of 02/20/2021 10:17  Ref. Range 01/05/2021 10:05  IgE (Immunoglobulin E), Serum Latest Ref Range: <OR=114 kU/L 18     has a past medical history of Anxiety, GERD (gastroesophageal reflux disease), and Hyperlipidemia.   reports that he has never smoked. He has never used smokeless tobacco.  Past Surgical History:  Procedure Laterality Date  . VASECTOMY      Allergies  Allergen Reactions  . Lisinopril Cough    Immunization History  Administered Date(s) Administered  . PFIZER(Purple Top)SARS-COV-2 Vaccination 12/13/2019, 01/03/2020  . Tdap 05/24/2015    Family History  Problem Relation Age of Onset  . Hypertension Other        fhx  . Cancer Other        fhx  . Coronary artery disease Other        fhx  . Coronary artery disease Father      Current Outpatient Medications:  .  HYDROcodone bit-homatropine (HYDROMET) 5-1.5 MG/5ML syrup, Take 5 mLs by mouth every 4 (four) hours as needed for cough., Disp: 240 mL, Rfl: 0 .  losartan (COZAAR) 50 MG tablet, Take 50 mg by mouth daily., Disp: , Rfl:       Objective:   Vitals:   02/20/21 1000  BP: 132/80  Pulse: 89  Temp: 98.2 F (36.8 C)  TempSrc: Temporal  SpO2: 96%  Weight: (!) 314 lb 9.6 oz (142.7 kg)  Height: 5\' 11"  (1.803 m)    Estimated body mass index is 43.88 kg/m as calculated from the following:   Height as of this encounter: 5\' 11"  (1.803 m).   Weight as of  this encounter: 314 lb 9.6 oz (142.7 kg).  @WEIGHTCHANGE @    02/20/21 1000  Weight: (!) 314 lb 9.6 oz (142.7 kg)     Physical Exam   General: No distress. Looks well Neuro: Alert and Oriented x 3. GCS 15. Speech normal Psych: Pleasant Resp:  Barrel Chest - n.  Wheeze - ono, Crackles - no, No overt respiratory distress CVS: Normal heart sounds. Murmurs - no Ext: Stigmata of Connective Tissue Disease - no HEENT: Normal upper airway. PEERL +. No post nasal drip        Assessment:       ICD-10-CM   1. Chronic cough  R05.3 Nitric oxide  2. Excessive consumption of soda pop  Z91.89   3. Irritable larynx  J38.7   4. Other chronic sinusitis  J32.8        Plan:     Patient Instructions     ICD-10-CM   1. Chronic cough  R05.3   2. Other chronic sinusitis  J32.8   3. Irritable larynx  J38.7   4. Excessive consumption of soda pop  Z91.89    You have chronic cough that started as a viral infection and now degenerated into chronic refractory cough [CRC] or cough neuropathy. Glad you are better but slow resolution is due to sinus issues and cough neuropathy. The evidence for asthma is low. There could be acid reflux due  To pepsi consumption  No evidence of lung cancer or pulmonary fibrosis or pneumonia  Plan -Definitely follow nonpharmaceutical recommendations to control cough neuropathy   - Take complete voice rest without any talking or whispering for 2 or 3 days  -Anytime he have the urge to cough or  clear your throat drink water or suck on sugarless lozenge -Pharmaceutical intervention: We discussed gabapentin but we took a shared decision making to hold off -Control sinus: Glad he is seeing ENT next week -Control potential acid reflux: Completely quit drinking soda pop -Potential asthma: Probably if asthma is very low.  Hold off on any inhaler therapy  Follow-up - Return in 3-4 months; RSI cough score at follow-up  -Return sooner if  needed     SIGNATURE    Dr. Kalman Shan, M.D., F.C.C.P,  Pulmonary and Critical Care Medicine Staff Physician, [facility] Center Director - Interstitial Lung Disease  Program  Pulmonary Fibrosis [facility] at [facility] [place], [place], [address]  Pager: [phone], If no answer or between  15:00h - 7:00h: call [phone] Telephone: [phone]  10:42 AM 02/20/2021

## 2021-05-03 ENCOUNTER — Other Ambulatory Visit: Payer: Self-pay | Admitting: Family Medicine

## 2021-05-04 ENCOUNTER — Other Ambulatory Visit: Payer: Self-pay | Admitting: Family Medicine

## 2021-10-19 ENCOUNTER — Encounter: Payer: Self-pay | Admitting: Family Medicine

## 2021-10-19 ENCOUNTER — Telehealth (INDEPENDENT_AMBULATORY_CARE_PROVIDER_SITE_OTHER): Payer: Commercial Managed Care - PPO | Admitting: Family Medicine

## 2021-10-19 ENCOUNTER — Telehealth: Payer: Self-pay | Admitting: Family Medicine

## 2021-10-19 DIAGNOSIS — J40 Bronchitis, not specified as acute or chronic: Secondary | ICD-10-CM | POA: Diagnosis not present

## 2021-10-19 MED ORDER — HYDROCODONE BIT-HOMATROP MBR 5-1.5 MG/5ML PO SOLN: 5.0000 mL | ORAL | 0 refills | Status: DC | PRN

## 2021-10-19 MED ORDER — METHYLPREDNISOLONE 4 MG PO TBPK: ORAL_TABLET | ORAL | 0 refills | Status: DC

## 2021-10-19 MED ORDER — AZITHROMYCIN 250 MG PO TABS: ORAL_TABLET | ORAL | 0 refills | Status: DC

## 2021-10-19 NOTE — Progress Notes (Signed)
° °  Subjective:    Patient ID: Daniel Rodriguez, male    DOB: 1971-09-04, 51 y.o.   MRN: [id number]  HPI Virtual Visit via Video Note  I connected with the patient on 10/19/21 at  3:45 PM EST by a video enabled telemedicine application and verified that I am speaking with the correct person using two identifiers.  Location patient: home Location provider:work or home office Persons participating in the virtual visit: patient, provider  I discussed the limitations of evaluation and management by telemedicine and the availability of in person appointments. The patient expressed understanding and agreed to proceed.   HPI: Here for one week of stuffy head, PND, hoarseness, and a dry cough. No fever or SOB. No body aches. Drinking fluids and taking Mucinex.    ROS: See pertinent positives and negatives per HPI.  Past Medical History:  Diagnosis Date   Anxiety    GERD (gastroesophageal reflux disease)    Hyperlipidemia     Past Surgical History:  Procedure Laterality Date   VASECTOMY      Family History  Problem Relation Age of Onset   Hypertension Other        fhx   Cancer Other        fhx   Coronary artery disease Other        fhx   Coronary artery disease Father      Current Outpatient Medications:    losartan (COZAAR) 50 MG tablet, Take 50 mg by mouth daily., Disp: , Rfl:    losartan-hydrochlorothiazide (HYZAAR) 50-12.5 MG tablet, TAKE 1 TABLET BY MOUTH DAILY, Disp: 90 tablet, Rfl: 0   azithromycin (ZITHROMAX Z-PAK) 250 MG tablet, As directed, Disp: 6 each, Rfl: 0   HYDROcodone bit-homatropine (HYCODAN) 5-1.5 MG/5ML syrup, Take 5 mLs by mouth every 4 (four) hours as needed., Disp: 240 mL, Rfl: 0   methylPREDNISolone (MEDROL DOSEPAK) 4 MG TBPK tablet, As directed, Disp: 21 tablet, Rfl: 0  EXAM:  VITALS per patient if applicable:  GENERAL: alert, oriented, appears well and in no acute distress. His voice is hoarse   HEENT: atraumatic, conjunttiva clear, no obvious  abnormalities on inspection of external nose and ears  NECK: normal movements of the head and neck  LUNGS: on inspection no signs of respiratory distress, breathing rate appears normal, no obvious gross SOB, gasping or wheezing  CV: no obvious cyanosis  MS: moves all visible extremities without noticeable abnormality  PSYCH/NEURO: pleasant and cooperative, no obvious depression or anxiety, speech and thought processing grossly intact  ASSESSMENT AND PLAN: Bronchitis, treat with a Zpack and a Medrol dose pack. Gershon Crane, MD  Discussed the following assessment and plan:  No diagnosis found.     I discussed the assessment and treatment plan with the patient. The patient was provided an opportunity to ask questions and all were answered. The patient agreed with the plan and demonstrated an understanding of the instructions.   The patient was advised to call back or seek an in-person evaluation if the symptoms worsen or if the condition fails to improve as anticipated.      Review of Systems     Objective:   Physical Exam        Assessment & Plan:

## 2021-10-19 NOTE — Telephone Encounter (Signed)
Patient calling in with respiratory symptoms: Shortness of breath, chest pain, palpitations or other red words send to Triage  Does the patient have a fever over 100, cough, congestion, sore throat, runny nose, lost of taste/smell (please list symptoms that patient has)?cough, chest congestion, post nasal drainage  What date did symptoms start?10-13-2021 (If over 5 days ago, pt may be scheduled for in person visit)  Have you tested for Covid in the last 5 days? No   If yes, was it positive []  OR negative [] ? If positive in the last 5 days, please schedule virtual visit now. If negative, schedule for an in person OV with the next available provider if PCP has no openings. Please also let patient know they will be tested again (follow the script below)  "you will have to arrive prior to your appt time to be Covid tested. Please park in back of office at the [facility] & call [phone] to let the staff know you have arrived. A staff member will meet you at your car to do a rapid covid test. Once the test has resulted you will be notified by phone of your results to determine if appt will remain an in person visit or be converted to a virtual/phone visit. If you arrive less than before your appt time, your visit will be automatically converted to virtual & any recommended testing will happen AFTER the visit." Pt ha virtual with dr fry 10-19-2021 345 pm  THINGS TO REMEMBER  If no availability for virtual visit in office,  please schedule another [place] office  If no availability at another [place] office, please instruct patient that they can schedule an evisit or virtual visit through their mychart account. Visits up to 8pm  patients can be seen in office 5 days after positive COVID test

## 2022-03-22 ENCOUNTER — Other Ambulatory Visit: Payer: Self-pay | Admitting: Family Medicine

## 2022-03-22 DIAGNOSIS — I10 Essential (primary) hypertension: Secondary | ICD-10-CM

## 2022-03-22 MED ORDER — LOSARTAN POTASSIUM-HCTZ 50-12.5 MG PO TABS: 1.0000 | ORAL_TABLET | Freq: Every day | ORAL | 0 refills | Status: DC

## 2022-04-22 ENCOUNTER — Other Ambulatory Visit: Payer: Self-pay | Admitting: Family Medicine

## 2022-04-22 DIAGNOSIS — I10 Essential (primary) hypertension: Secondary | ICD-10-CM

## 2022-05-27 ENCOUNTER — Other Ambulatory Visit: Payer: Self-pay | Admitting: Family Medicine

## 2022-05-27 DIAGNOSIS — I10 Essential (primary) hypertension: Secondary | ICD-10-CM

## 2022-06-24 ENCOUNTER — Other Ambulatory Visit: Payer: Self-pay | Admitting: Family Medicine

## 2022-06-24 DIAGNOSIS — I10 Essential (primary) hypertension: Secondary | ICD-10-CM

## 2022-07-26 ENCOUNTER — Other Ambulatory Visit: Payer: Self-pay | Admitting: Family Medicine

## 2022-07-26 DIAGNOSIS — I10 Essential (primary) hypertension: Secondary | ICD-10-CM

## 2022-08-12 ENCOUNTER — Other Ambulatory Visit: Payer: Self-pay | Admitting: Family Medicine

## 2022-08-12 DIAGNOSIS — I10 Essential (primary) hypertension: Secondary | ICD-10-CM

## 2022-09-12 ENCOUNTER — Other Ambulatory Visit: Payer: Self-pay | Admitting: Family Medicine

## 2022-09-12 DIAGNOSIS — I10 Essential (primary) hypertension: Secondary | ICD-10-CM

## 2022-09-13 ENCOUNTER — Other Ambulatory Visit: Payer: Self-pay | Admitting: Family Medicine

## 2022-09-13 DIAGNOSIS — I10 Essential (primary) hypertension: Secondary | ICD-10-CM

## 2022-11-20 ENCOUNTER — Other Ambulatory Visit: Payer: Self-pay | Admitting: Family Medicine

## 2022-11-20 DIAGNOSIS — I10 Essential (primary) hypertension: Secondary | ICD-10-CM

## 2023-02-02 ENCOUNTER — Other Ambulatory Visit: Payer: Self-pay | Admitting: Family Medicine

## 2023-02-02 DIAGNOSIS — I10 Essential (primary) hypertension: Secondary | ICD-10-CM

## 2023-03-03 IMAGING — CT CT PARANASAL SINUSES LIMITED
1 of 4 series · 10 of 26 positions shown, 13 images · non-contrast
Comparison: No pertinent prior exams available for comparison.

CLINICAL DATA: Chronic cough, chronic sinusitis. Evaluate for
sinusitis. Persistent dry rattling cough for 6 months.

EXAM:
CT PARANASAL SINUS LIMITED WITHOUT CONTRAST
TECHNIQUE: Non-contiguous multidetector CT images of the paranasal sinuses were
obtained in a single plane without contrast.

[Series 4: limited sinus bone · axial · 0.27mm/px · z∈[+1503,+1593]mm · 10 of 12 slices shown, 13 images]
[im 2/12  brain]
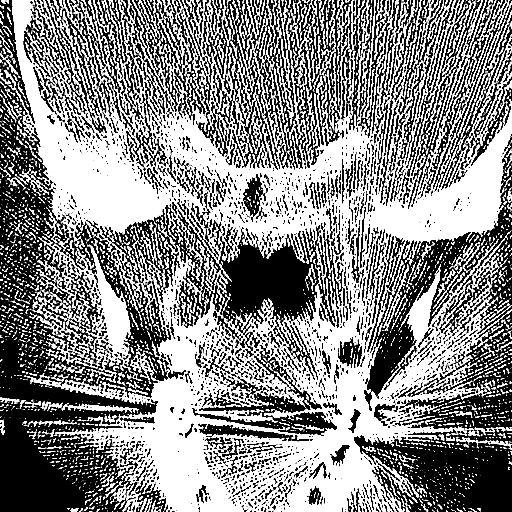
[im 2/12  bone]
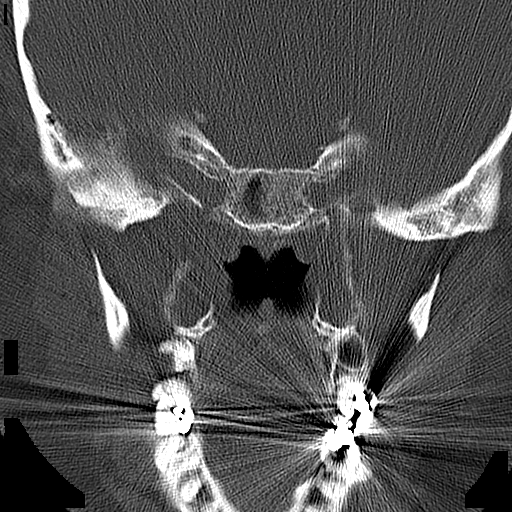
[im 3/12  bone]
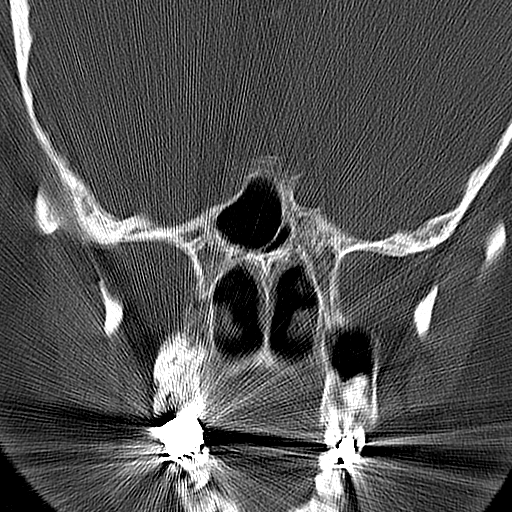
[im 4/12  bone]
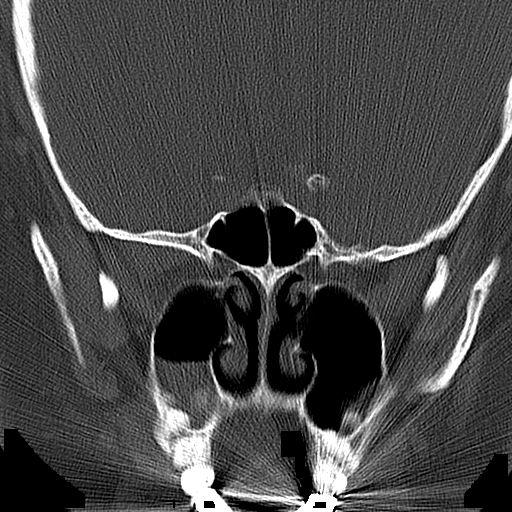
[im 5/12  bone]
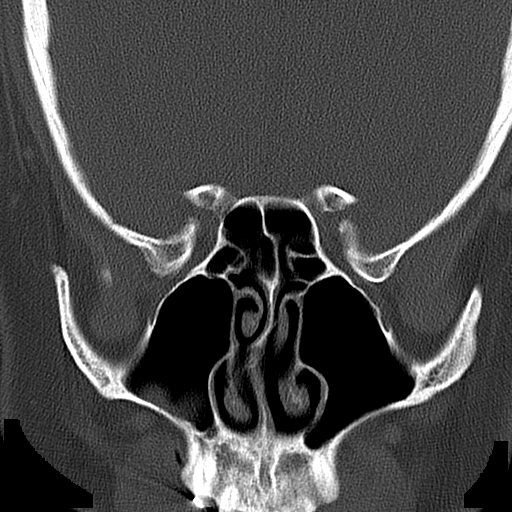
[im 6/12  brain]
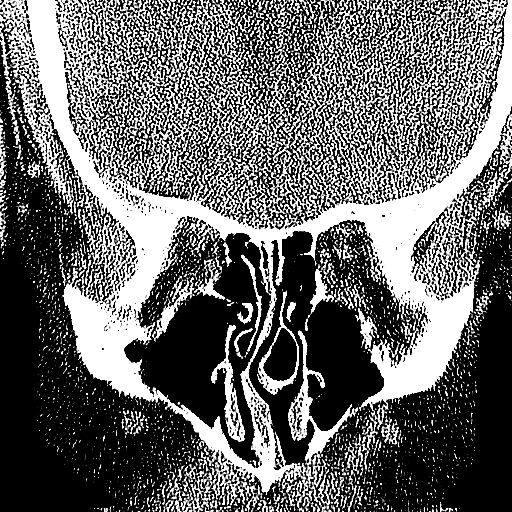
[im 6/12  bone]
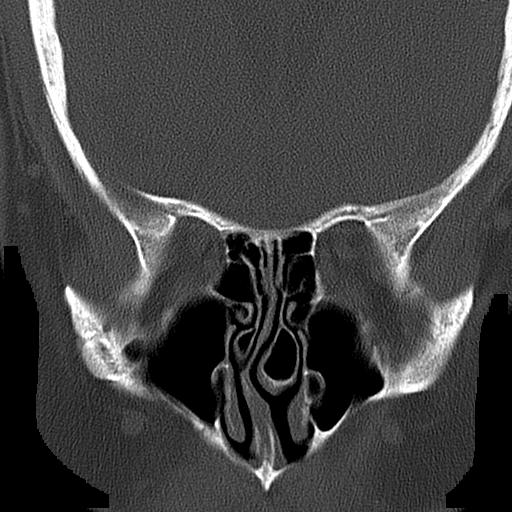
[im 7/12  bone]
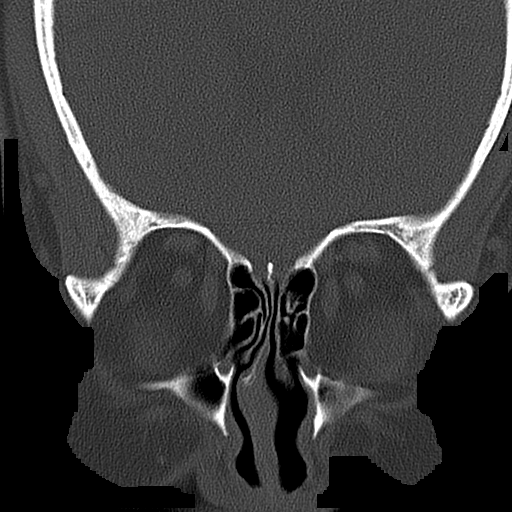
[im 8/12  bone]
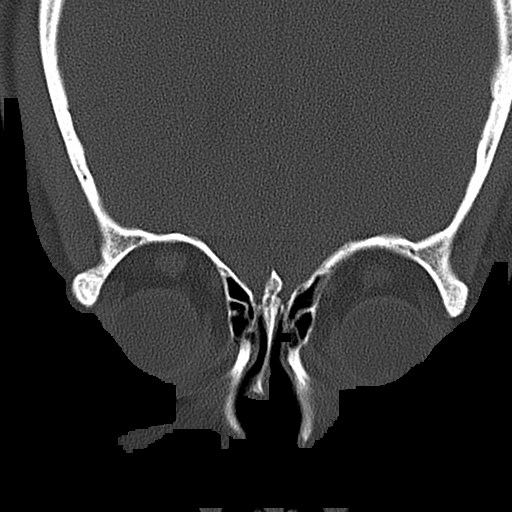
[im 9/12  bone]
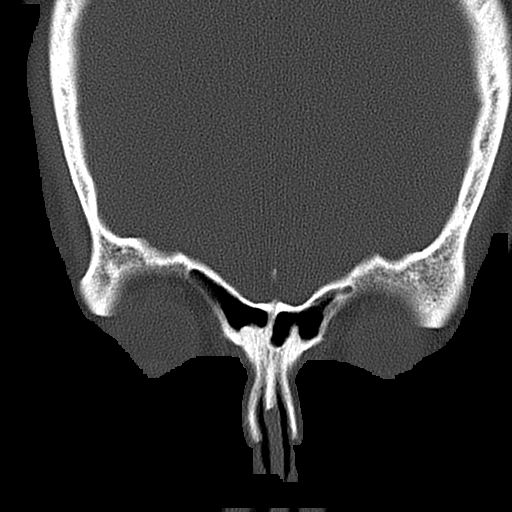
[im 10/12  brain]
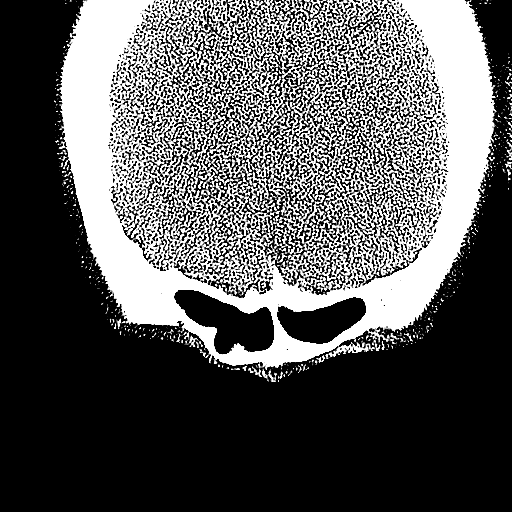
[im 10/12  bone]
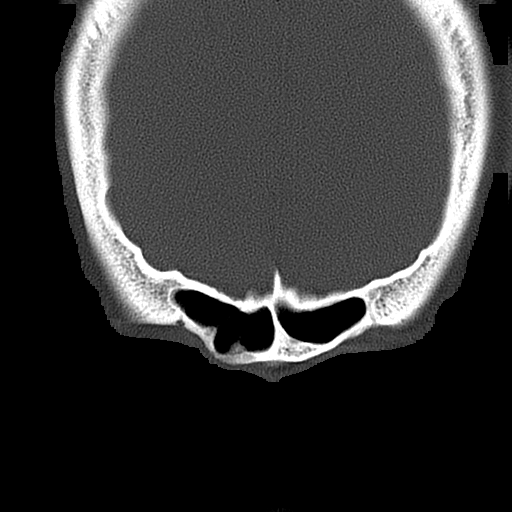
[im 11/12  bone]
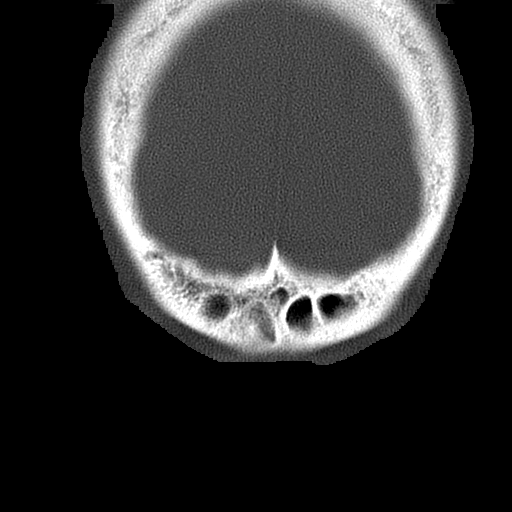

[10 of 26 positions shown; findings below may reference images not displayed]

FINDINGS: Frontal sinuses: Trace mucosal thickening within the left frontal
sinus. Right frontal sinus normally aerated.

Ethmoid sinuses: Normally aerated.

Sphenoid sinuses: Normally aerated.

Maxillary sinuses: Moderate fluid level versus mucosal thickening
within the left maxillary sinus. Right maxillary sinus normally
aerated.

Ostiomeatal units: Patent.

Nasal passages: Leftward deviation of the bony nasal septum.
Bilateral middle turbinate concha bullosa.

Other: No acute or significant finding within the imaged orbits.
IMPRESSION: Moderate fluid level versus mucosal thickening within the left
maxillary sinus.

Trace mucosal thickening within the left frontal sinus.

The paranasal sinuses are otherwise normally aerated.

Leftward deviation of the bony nasal septum.

Bilateral middle turbinate concha bullosa (more prominent on the
right).

## 2023-06-09 ENCOUNTER — Encounter: Payer: Self-pay | Admitting: Family Medicine

## 2023-06-09 ENCOUNTER — Telehealth: Payer: Commercial Managed Care - PPO | Admitting: Family Medicine

## 2023-06-09 DIAGNOSIS — J069 Acute upper respiratory infection, unspecified: Secondary | ICD-10-CM

## 2023-06-09 MED ORDER — AZITHROMYCIN 250 MG PO TABS: ORAL_TABLET | ORAL | 0 refills | Status: DC

## 2023-06-09 MED ORDER — METHYLPREDNISOLONE 4 MG PO TBPK: ORAL_TABLET | ORAL | 0 refills | Status: DC

## 2023-06-09 MED ORDER — HYDROCODONE BIT-HOMATROP MBR 5-1.5 MG/5ML PO SOLN: 5.0000 mL | ORAL | 0 refills | Status: DC | PRN

## 2023-06-09 NOTE — Progress Notes (Signed)
   Subjective:    Patient ID: Daniel Rodriguez, male    DOB: 1971-04-27, 52 y.o.   MRN: [id number]  HPI Virtual Visit via Video Note  I connected with the patient on 06/09/23 at  8:15 AM EDT by a video enabled telemedicine application and verified that I am speaking with the correct person using two identifiers.  Location patient: home Location provider:work or home office Persons participating in the virtual visit: patient, provider  I discussed the limitations of evaluation and management by telemedicine and the availability of in person appointments. The patient expressed understanding and agreed to proceed.   HPI: Here for 3 days of stuffy head, PND, ST, and dry cough. No fever or SOB. Taking Dayquil and Nyquil.    ROS: See pertinent positives and negatives per HPI.  Past Medical History:  Diagnosis Date   Anxiety    GERD (gastroesophageal reflux disease)    Hyperlipidemia     Past Surgical History:  Procedure Laterality Date   VASECTOMY      Family History  Problem Relation Age of Onset   Hypertension Other        fhx   Cancer Other        fhx   Coronary artery disease Other        fhx   Coronary artery disease Father      Current Outpatient Medications:    HYDROcodone bit-homatropine (HYCODAN) 5-1.5 MG/5ML syrup, Take 5 mLs by mouth every 4 (four) hours as needed. (Patient not taking: Reported on 06/09/2023), Disp: 240 mL, Rfl: 0   losartan (COZAAR) 50 MG tablet, Take 50 mg by mouth daily. (Patient not taking: Reported on 06/09/2023), Disp: , Rfl:    losartan-hydrochlorothiazide (HYZAAR) 50-12.5 MG tablet, TAKE 1 TABLET BY MOUTH DAILY (Patient not taking: Reported on 06/09/2023), Disp: 30 tablet, Rfl: 1  EXAM:  VITALS per patient if applicable:  GENERAL: alert, oriented, appears well and in no acute distress  HEENT: atraumatic, conjunttiva clear, no obvious abnormalities on inspection of external nose and ears  NECK: normal movements of the head and  neck  LUNGS: on inspection no signs of respiratory distress, breathing rate appears normal, no obvious gross SOB, gasping or wheezing  CV: no obvious cyanosis  MS: moves all visible extremities without noticeable abnormality  PSYCH/NEURO: pleasant and cooperative, no obvious depression or anxiety, speech and thought processing grossly intact  ASSESSMENT AND PLAN: URI, treat with a Zpack and a Medrol dose pack.  Gershon Crane, MD  Discussed the following assessment and plan:  No diagnosis found.     I discussed the assessment and treatment plan with the patient. The patient was provided an opportunity to ask questions and all were answered. The patient agreed with the plan and demonstrated an understanding of the instructions.   The patient was advised to call back or seek an in-person evaluation if the symptoms worsen or if the condition fails to improve as anticipated.      Review of Systems     Objective:   Physical Exam        Assessment & Plan:

## 2023-06-28 ENCOUNTER — Encounter: Payer: Self-pay | Admitting: Family Medicine

## 2023-06-28 ENCOUNTER — Ambulatory Visit: Payer: Commercial Managed Care - PPO | Admitting: Family Medicine

## 2023-06-28 VITALS — BP 138/94 | HR 65 | Temp 98.4°F | Wt 313.0 lb

## 2023-06-28 DIAGNOSIS — H6991 Unspecified Eustachian tube disorder, right ear: Secondary | ICD-10-CM

## 2023-06-28 NOTE — Progress Notes (Signed)
   Subjective:    Patient ID: Daniel Rodriguez, male    DOB: 06/29/1971, 52 y.o.   MRN: [id number]  HPI Here for a pain in the right ear that started 4 days ago, but which is gone today. He tested positive for Covid on 06-09-23, and he had head congestion, ST, and dry cough at that time. Most of his symptoms have resolved except for the head congestion. He then developed the right ear pain. He has taken Sudafed with some relief.    Review of Systems  Constitutional: Negative.   HENT:  Positive for congestion and ear pain. Negative for postnasal drip, sinus pain and sore throat.   Eyes: Negative.   Respiratory: Negative.         Objective:   Physical Exam Constitutional:      Appearance: Normal appearance.  HENT:     Right Ear: Tympanic membrane, ear canal and external ear normal.     Left Ear: Tympanic membrane, ear canal and external ear normal.     Nose: Nose normal.     Mouth/Throat:     Pharynx: Oropharynx is clear.  Eyes:     Conjunctiva/sclera: Conjunctivae normal.  Pulmonary:     Effort: Pulmonary effort is normal.     Breath sounds: Normal breath sounds.  Lymphadenopathy:     Cervical: No cervical adenopathy.  Neurological:     Mental Status: He is alert.           Assessment & Plan:  He is recovering from a Covid infection, and he has had some eustachian tube dysfunction. This seems to be resolving as well. I did suggest he use some Flonase sprays along with the Sudafed as needed.  Gershon Crane, MD

## 2023-07-01 ENCOUNTER — Other Ambulatory Visit: Payer: Self-pay | Admitting: Family Medicine

## 2023-07-01 DIAGNOSIS — Z1211 Encounter for screening for malignant neoplasm of colon: Secondary | ICD-10-CM

## 2023-07-01 DIAGNOSIS — Z1212 Encounter for screening for malignant neoplasm of rectum: Secondary | ICD-10-CM

## 2023-09-13 ENCOUNTER — Encounter: Payer: Self-pay | Admitting: Family Medicine

## 2023-09-13 ENCOUNTER — Telehealth: Payer: Commercial Managed Care - PPO | Admitting: Family Medicine

## 2023-09-13 VITALS — Wt 313.0 lb

## 2023-09-13 DIAGNOSIS — J40 Bronchitis, not specified as acute or chronic: Secondary | ICD-10-CM | POA: Diagnosis not present

## 2023-09-13 MED ORDER — HYDROCODONE BIT-HOMATROP MBR 5-1.5 MG/5ML PO SOLN: 5.0000 mL | ORAL | 0 refills | Status: DC | PRN

## 2023-09-13 MED ORDER — AZITHROMYCIN 250 MG PO TABS: ORAL_TABLET | ORAL | 0 refills | Status: DC

## 2023-09-13 MED ORDER — METHYLPREDNISOLONE 4 MG PO TBPK: ORAL_TABLET | ORAL | 0 refills | Status: DC

## 2023-09-13 NOTE — Progress Notes (Signed)
   Subjective:    Patient ID: Daniel Rodriguez, male    DOB: [birth year], 52 y.o.   MRN: [id number]  HPI Virtual Visit via Video Note  I connected with the patient on 09/13/23 at  9:00 AM EST by a video enabled telemedicine application and verified that I am speaking with the correct person using two identifiers.  Location patient: home Location provider:work or home office Persons participating in the virtual visit: patient, provider  I discussed the limitations of evaluation and management by telemedicine and the availability of in person appointments. The patient expressed understanding and agreed to proceed.   HPI: Here for one week of PND, wheezing, and a dry cough. No fever or SOB. Using Benadryl.    ROS: See pertinent positives and negatives per HPI.  Past Medical History:  Diagnosis Date   Anxiety    GERD (gastroesophageal reflux disease)    Hyperlipidemia     Past Surgical History:  Procedure Laterality Date   VASECTOMY      Family History  Problem Relation Age of Onset   Hypertension Other        fhx   Cancer Other        fhx   Coronary artery disease Other        fhx   Coronary artery disease Father      Current Outpatient Medications:    HYDROcodone bit-homatropine (HYCODAN) 5-1.5 MG/5ML syrup, Take 5 mLs by mouth every 4 (four) hours as needed., Disp: 240 mL, Rfl: 0  EXAM:  VITALS per patient if applicable:  GENERAL: alert, oriented, appears well and in no acute distress  HEENT: atraumatic, conjunttiva clear, no obvious abnormalities on inspection of external nose and ears  NECK: normal movements of the head and neck  LUNGS: on inspection no signs of respiratory distress, breathing rate appears normal, no obvious gross SOB, gasping or wheezing  CV: no obvious cyanosis  MS: moves all visible extremities without noticeable abnormality  PSYCH/NEURO: pleasant and cooperative, no obvious depression or anxiety, speech and thought processing grossly  intact  ASSESSMENT AND PLAN: Bronchitis, treat with a Zpack and a Medrol dose pack.  Gershon Crane, MD  Discussed the following assessment and plan:  No diagnosis found.     I discussed the assessment and treatment plan with the patient. The patient was provided an opportunity to ask questions and all were answered. The patient agreed with the plan and demonstrated an understanding of the instructions.   The patient was advised to call back or seek an in-person evaluation if the symptoms worsen or if the condition fails to improve as anticipated.      Review of Systems     Objective:   Physical Exam        Assessment & Plan:

## 2024-07-11 ENCOUNTER — Encounter: Admitting: Family Medicine

## 2024-07-23 ENCOUNTER — Encounter: Admitting: Family Medicine

## 2024-08-17 ENCOUNTER — Ambulatory Visit (INDEPENDENT_AMBULATORY_CARE_PROVIDER_SITE_OTHER): Admitting: Family Medicine

## 2024-08-17 ENCOUNTER — Ambulatory Visit: Payer: Self-pay | Admitting: Family Medicine

## 2024-08-17 ENCOUNTER — Encounter: Payer: Self-pay | Admitting: Family Medicine

## 2024-08-17 VITALS — BP 142/86 | HR 71 | Temp 98.0°F | Ht 70.0 in | Wt 314.8 lb

## 2024-08-17 DIAGNOSIS — E538 Deficiency of other specified B group vitamins: Secondary | ICD-10-CM | POA: Diagnosis not present

## 2024-08-17 DIAGNOSIS — Z0000 Encounter for general adult medical examination without abnormal findings: Secondary | ICD-10-CM | POA: Diagnosis not present

## 2024-08-17 LAB — CBC WITH DIFFERENTIAL/PLATELET
Basophils Absolute: 0 K/uL (ref 0.0–0.1)
Basophils Relative: 0.6 % (ref 0.0–3.0)
Eosinophils Absolute: 0.4 K/uL (ref 0.0–0.7)
Eosinophils Relative: 4.5 % (ref 0.0–5.0)
HCT: 48.2 % (ref 39.0–52.0)
Hemoglobin: 16.3 g/dL (ref 13.0–17.0)
Lymphocytes Relative: 23.1 % (ref 12.0–46.0)
Lymphs Abs: 2 K/uL (ref 0.7–4.0)
MCHC: 33.8 g/dL (ref 30.0–36.0)
MCV: 86.4 fl (ref 78.0–100.0)
Monocytes Absolute: 0.6 K/uL (ref 0.1–1.0)
Monocytes Relative: 7.2 % (ref 3.0–12.0)
Neutro Abs: 5.5 K/uL (ref 1.4–7.7)
Neutrophils Relative %: 64.6 % (ref 43.0–77.0)
Platelets: 240 K/uL (ref 150.0–400.0)
RBC: 5.57 Mil/uL (ref 4.22–5.81)
RDW: 13.9 % (ref 11.5–15.5)
WBC: 8.5 K/uL (ref 4.0–10.5)

## 2024-08-17 LAB — LIPID PANEL
Cholesterol: 184 mg/dL (ref 0–200)
HDL: 31.7 mg/dL — ABNORMAL LOW
LDL Cholesterol: 124 mg/dL — ABNORMAL HIGH (ref 0–99)
NonHDL: 152.76
Total CHOL/HDL Ratio: 6
Triglycerides: 143 mg/dL (ref 0.0–149.0)
VLDL: 28.6 mg/dL (ref 0.0–40.0)

## 2024-08-17 LAB — BASIC METABOLIC PANEL WITH GFR
BUN: 11 mg/dL (ref 6–23)
CO2: 29 meq/L (ref 19–32)
Calcium: 8.7 mg/dL (ref 8.4–10.5)
Chloride: 103 meq/L (ref 96–112)
Creatinine, Ser: 0.88 mg/dL (ref 0.40–1.50)
GFR: 96.72 mL/min
Glucose, Bld: 94 mg/dL (ref 70–99)
Potassium: 4.3 meq/L (ref 3.5–5.1)
Sodium: 141 meq/L (ref 135–145)

## 2024-08-17 LAB — HEPATIC FUNCTION PANEL
ALT: 31 U/L (ref 0–53)
AST: 22 U/L (ref 0–37)
Albumin: 4.3 g/dL (ref 3.5–5.2)
Alkaline Phosphatase: 112 U/L (ref 39–117)
Bilirubin, Direct: 0.1 mg/dL (ref 0.0–0.3)
Total Bilirubin: 0.9 mg/dL (ref 0.2–1.2)
Total Protein: 6.6 g/dL (ref 6.0–8.3)

## 2024-08-17 LAB — HEMOGLOBIN A1C: Hgb A1c MFr Bld: 5.5 % (ref 4.6–6.5)

## 2024-08-17 LAB — PSA: PSA: 1.57 ng/mL (ref 0.10–4.00)

## 2024-08-17 LAB — TSH: TSH: 2.23 u[IU]/mL (ref 0.35–5.50)

## 2024-08-17 LAB — VITAMIN B12: Vitamin B-12: 318 pg/mL (ref 211–911)

## 2024-08-17 MED ORDER — LOSARTAN POTASSIUM 50 MG PO TABS: 50.0000 mg | ORAL_TABLET | Freq: Every day | ORAL | 3 refills | Status: AC

## 2024-08-17 NOTE — Progress Notes (Signed)
 Subjective:    Patient ID: Daniel Rodriguez, male    DOB: April 25, 1971, 53 y.o.   MRN: [id number]  HPI Here for a well exam. He says he feels tired all the time, and he knows that his obesity causes a lot of his problems. He tries to watch his diet, but he has not exercised for several years. He also mentions some mild numbness and tingling in his feet for the past few years. His BP has been borderline high with systolic readings in the 140's.    Review of Systems  Constitutional:  Positive for fatigue.  HENT: Negative.    Eyes: Negative.   Respiratory: Negative.    Cardiovascular: Negative.   Gastrointestinal: Negative.   Genitourinary: Negative.   Musculoskeletal: Negative.   Skin: Negative.   Neurological:  Positive for numbness.  Psychiatric/Behavioral: Negative.         Objective:   Physical Exam Constitutional:      General: He is not in acute distress.    Appearance: He is well-developed. He is obese. He is not diaphoretic.  HENT:     Head: Normocephalic and atraumatic.     Right Ear: External ear normal.     Left Ear: External ear normal.     Nose: Nose normal.     Mouth/Throat:     Pharynx: No oropharyngeal exudate.  Eyes:     General: No scleral icterus.       Right eye: No discharge.        Left eye: No discharge.     Conjunctiva/sclera: Conjunctivae normal.     Pupils: Pupils are equal, round, and reactive to light.  Neck:     Thyroid : No thyromegaly.     Vascular: No JVD.     Trachea: No tracheal deviation.  Cardiovascular:     Rate and Rhythm: Normal rate and regular rhythm.     Pulses: Normal pulses.     Heart sounds: Normal heart sounds. No murmur heard.    No friction rub. No gallop.  Pulmonary:     Effort: Pulmonary effort is normal. No respiratory distress.     Breath sounds: Normal breath sounds. No wheezing or rales.  Chest:     Chest wall: No tenderness.  Abdominal:     General: Bowel sounds are normal. There is no distension.     Palpations:  Abdomen is soft. There is no mass.     Tenderness: There is no abdominal tenderness. There is no guarding or rebound.  Genitourinary:    Penis: Normal. No tenderness.      Testes: Normal.     Prostate: Normal.     Rectum: Normal. Guaiac result negative.  Musculoskeletal:        General: No tenderness. Normal range of motion.     Cervical back: Neck supple.  Lymphadenopathy:     Cervical: No cervical adenopathy.  Skin:    General: Skin is warm and dry.     Coloration: Skin is not pale.     Findings: No erythema or rash.  Neurological:     General: No focal deficit present.     Mental Status: He is alert and oriented to person, place, and time.     Cranial Nerves: No cranial nerve deficit.     Motor: No abnormal muscle tone.     Coordination: Coordination normal.     Deep Tendon Reflexes: Reflexes are normal and symmetric. Reflexes normal.  Psychiatric:  Mood and Affect: Mood normal.        Behavior: Behavior normal.        Thought Content: Thought content normal.        Judgment: Judgment normal.           Assessment & Plan:  Well exam. We discussed diet and exercise. Get fasting labs. He has a mild neuropathy, so we will add a B12 level to the labs. We will set up a colonoscopy. We spoke at length about his weight, and he acknowledges his need to exercise. We also discussed the possibility of him using a GLP-1 product. He will check with his insurance company about this.  Garnette Olmsted, MD
